# Patient Record
Sex: Male | Born: 1970 | Marital: Single | Smoking: Never smoker
Health system: Southern US, Community
[De-identification: ages and names within clinical notes are randomized; demographics above are authoritative.]

## PROBLEM LIST (undated history)

## (undated) DIAGNOSIS — K589 Irritable bowel syndrome without diarrhea: Secondary | ICD-10-CM

## (undated) DIAGNOSIS — N289 Disorder of kidney and ureter, unspecified: Secondary | ICD-10-CM

## (undated) DIAGNOSIS — T7840XA Allergy, unspecified, initial encounter: Secondary | ICD-10-CM

## (undated) DIAGNOSIS — E119 Type 2 diabetes mellitus without complications: Secondary | ICD-10-CM

## (undated) DIAGNOSIS — F419 Anxiety disorder, unspecified: Secondary | ICD-10-CM

## (undated) HISTORY — PX: OTHER SURGICAL HISTORY: SHX169

## (undated) HISTORY — DX: Irritable bowel syndrome without diarrhea: K58.9

## (undated) HISTORY — DX: Anxiety disorder, unspecified: F41.9

## (undated) HISTORY — DX: Type 2 diabetes mellitus without complications: E11.9

## (undated) HISTORY — DX: Allergy, unspecified, initial encounter: T78.40XA

## (undated) HISTORY — DX: Disorder of kidney and ureter, unspecified: N28.9

---

## 2014-04-10 ENCOUNTER — Ambulatory Visit: Payer: Self-pay | Admitting: Urology

## 2014-04-10 LAB — CBC
HCT: 44.5 % (ref 40.0–52.0)
HGB: 14.9 g/dL (ref 13.0–18.0)
MCH: 31.3 pg (ref 26.0–34.0)
MCHC: 33.4 g/dL (ref 32.0–36.0)
MCV: 94 fL (ref 80–100)
Platelet: 237 10*3/uL (ref 150–440)
RBC: 4.76 10*6/uL (ref 4.40–5.90)
RDW: 13.5 % (ref 11.5–14.5)
WBC: 9.1 10*3/uL (ref 3.8–10.6)

## 2014-04-10 LAB — URINALYSIS, COMPLETE
Bacteria: NONE SEEN
Bilirubin,UR: NEGATIVE
Glucose,UR: 50 mg/dL (ref 0–75)
LEUKOCYTE ESTERASE: NEGATIVE
NITRITE: NEGATIVE
PH: 5 (ref 4.5–8.0)
Specific Gravity: 1.03 (ref 1.003–1.030)
Squamous Epithelial: NONE SEEN

## 2014-04-10 LAB — COMPREHENSIVE METABOLIC PANEL
ANION GAP: 8 (ref 7–16)
Albumin: 4.2 g/dL (ref 3.4–5.0)
Alkaline Phosphatase: 53 U/L (ref 46–116)
BILIRUBIN TOTAL: 0.5 mg/dL (ref 0.2–1.0)
BUN: 13 mg/dL (ref 7–18)
CHLORIDE: 104 mmol/L (ref 98–107)
Calcium, Total: 9.6 mg/dL (ref 8.5–10.1)
Co2: 28 mmol/L (ref 21–32)
Creatinine: 1.15 mg/dL (ref 0.60–1.30)
EGFR (African American): >60
Glucose: 175 mg/dL — ABNORMAL HIGH (ref 65–99)
OSMOLALITY: 284 (ref 275–301)
Potassium: 3.5 mmol/L (ref 3.5–5.1)
SGOT(AST): 33 U/L (ref 15–37)
SGPT (ALT): 43 U/L (ref 14–63)
SODIUM: 140 mmol/L (ref 136–145)
Total Protein: 7.6 g/dL (ref 6.4–8.2)

## 2014-04-10 LAB — LIPASE, BLOOD: Lipase: 121 U/L (ref 73–393)

## 2014-06-04 ENCOUNTER — Ambulatory Visit: Payer: Self-pay | Admitting: Urology

## 2014-06-07 ENCOUNTER — Ambulatory Visit
Admit: 2014-06-07 | Disposition: A | Discharge status: Home / Self Care | Payer: Self-pay | Attending: Urology | Admitting: Urology

## 2014-06-07 LAB — URINALYSIS, COMPLETE
Bacteria: NONE SEEN
Bilirubin,UR: NEGATIVE
Glucose,UR: NEGATIVE mg/dL (ref 0–75)
KETONE: NEGATIVE
Nitrite: NEGATIVE
Ph: 5 (ref 4.5–8.0)
Protein: 100
SQUAMOUS EPITHELIAL: NONE SEEN
Specific Gravity: 1.017 (ref 1.003–1.030)
WBC UR: 19 /HPF (ref 0–5)

## 2014-06-07 LAB — BASIC METABOLIC PANEL
Anion Gap: 7 (ref 7–16)
BUN: 11 mg/dL
CO2: 30 mmol/L
Calcium, Total: 9.4 mg/dL
Chloride: 102 mmol/L
Creatinine: 0.78 mg/dL
EGFR (Non-African Amer.): >60
Glucose: 112 mg/dL — ABNORMAL HIGH
Potassium: 3.8 mmol/L
SODIUM: 139 mmol/L

## 2014-06-07 LAB — CBC
HCT: 42.1 % (ref 40.0–52.0)
HGB: 14.1 g/dL (ref 13.0–18.0)
MCH: 31.6 pg (ref 26.0–34.0)
MCHC: 33.5 g/dL (ref 32.0–36.0)
MCV: 94 fL (ref 80–100)
PLATELETS: 236 10*3/uL (ref 150–440)
RBC: 4.46 10*6/uL (ref 4.40–5.90)
RDW: 13.4 % (ref 11.5–14.5)
WBC: 4.8 10*3/uL (ref 3.8–10.6)

## 2014-06-07 LAB — PROTIME-INR
INR: 1
PROTHROMBIN TIME: 13.1 s

## 2014-06-08 LAB — URINE CULTURE

## 2014-06-12 ENCOUNTER — Ambulatory Visit
Admit: 2014-06-12 | Disposition: A | Discharge status: Home / Self Care | Payer: Self-pay | Attending: Urology | Admitting: Urology

## 2014-06-13 LAB — BASIC METABOLIC PANEL
Anion Gap: 8 (ref 7–16)
BUN: 8 mg/dL
CO2: 27 mmol/L
CREATININE: 0.82 mg/dL
Calcium, Total: 8.5 mg/dL — ABNORMAL LOW
Chloride: 105 mmol/L
GLUCOSE: 111 mg/dL — AB
Potassium: 3.6 mmol/L
SODIUM: 140 mmol/L

## 2014-06-13 LAB — CBC WITH DIFFERENTIAL/PLATELET
Basophil #: 0 10*3/uL (ref 0.0–0.1)
Basophil %: 0.5 %
EOS ABS: 0 10*3/uL (ref 0.0–0.7)
Eosinophil %: 0.4 %
HCT: 39.7 % — AB (ref 40.0–52.0)
HGB: 13.1 g/dL (ref 13.0–18.0)
LYMPHS ABS: 2.5 10*3/uL (ref 1.0–3.6)
Lymphocyte %: 29.4 %
MCH: 30.9 pg (ref 26.0–34.0)
MCHC: 32.8 g/dL (ref 32.0–36.0)
MCV: 94 fL (ref 80–100)
Monocyte #: 0.7 x10 3/mm (ref 0.2–1.0)
Monocyte %: 8.1 %
NEUTROS ABS: 5.3 10*3/uL (ref 1.4–6.5)
NEUTROS PCT: 61.6 %
Platelet: 219 10*3/uL (ref 150–440)
RBC: 4.22 10*6/uL — ABNORMAL LOW (ref 4.40–5.90)
RDW: 13.4 % (ref 11.5–14.5)
WBC: 8.6 10*3/uL (ref 3.8–10.6)

## 2014-07-07 NOTE — Op Note (Signed)
PATIENT NAME:  Nicholas Mitchell, Nicholas Mitchell MR#:  [id number] DATE OF BIRTH:  June 19, 1970  DATE OF PROCEDURE:  06/12/2014  PREOPERATIVE DIAGNOSIS:  Right kidney stone.   POSTOPERATIVE DIAGNOSIS:  Right kidney stone.   PROCEDURES PERFORMED:  1. Right percutaneous nephrolithotomy.  2. Right antegrade nephrostogram.  3. Right ureteral stent removal.  4. Basket extraction of kidney stones.  5. Interpretation of fluoroscopy less than 30 minutes.  6.  Placement of right nephrostomy tube  ATTENDING SURGEON: Claris GladdenAshley J. Minh Jasper, MD.    ANESTHESIA: General anesthesia.   ESTIMATED BLOOD LOSS: 100 mL.   DRAINS: A 16 French Foley catheter, 7818 French nephrostomy tube, a 5 [place] open-ended ureteral catheter.   SPECIMEN: Stone fragment.   COMPLICATIONS: None.   INDICATION: This is a 44 year old male who presented previously to the Emergency Room with acute onset right flank pain, found to have a punctate UVJ stone as well as a quite large 1.5 cm obstructing right renal pelvic stone, as well as some nonobstructing lower pole stones. He underwent urgent ureteral stent placement at the time and returned today for definitive management of his larger upper tract stones. We discussed various treatment options including ureteroscopy versus ESWL, versus percutaneous nephrolithotomy and he has elected to undergo percutaneous nephrolithotomy to treat all of his stones simultaneously. Risks and benefits of the procedure were explained in detail. The patient agreed to proceed with the planned procedure.    DESCRIPTION OF PROCEDURE:  The patient was correctly identified in the preoperative holding area and informed consent was confirmed. He was brought to the operating suite and placed on the table in supine position. At this time universal timeout protocol was performed. All team members were identified. Venodyne boots were placed and the patient was administered 500 mg of IV Levaquin in the perioperative period. He was then placed  under general anesthesia and intubated, A Foley catheter was placed sterilely on the field. He was then carefully turned in the prone position onto the operating room table from the stretcher and care was taken to pad all pressure points including his axillae, hips, knees, and feet.  He was strapped to the table using 3 separate straps. His nephroureteral stent dressing was removed which had been previously placed in interventional radiology and he was prepped and draped in the standard surgical fashion. At this point in time the 5 French nephroureteral stent which had been placed in interventional radiology was cannulated using a 0.038 Sensor wire down to the level of the bladder without difficulty, confirmed under fluoroscopic guidance. The catheter was removed. An approximately centimeter long incision was made in the right flank at the site of the wire insertion and a safety wire introducer was advanced to the level of the renal pelvis, allowing for the introduction of a Super Stiff wire down to the level of the bladder. The Sensor wire was placed, then snapped in place as a safety wire and the Super Stiff wire was used at this point as a working wire. A fascial incisor needle was placed over the wire into the fascia to create a cruciate type incision within the patient's fascia. A NephroMax 30 cm balloon was then advanced over the wire such that the distal tip of the balloon was located within the lower pole infundibulum. Once in the appropriate position the balloon was inflated up to 18 cm of water and left in place for several minutes to dilate the nephrostomy tube tract. A 30 cm access sheath was advanced over this balloon just  to the distal tip of the balloon under fluoroscopic guidance without difficulty. The balloon was then deflated and removed. There was no significant bleeding noted at this point in time. The nephroscope was then introduced through the sheath into the lower pole, at which time normal  mucosa was identified within the kidney. The scope was able to be advanced to the level of the renal pelvis, at which time the large 1.5 cm stone was encountered. The CyberWand ultrasonic lithotripter was then used to fragment the stone and suck up the pieces. The stone fragmented quite nicely.  Once the stone was completely obliterated and all remaining fragments were removed I then attempted to access the extreme lower pole using the nephroscope, but given the angle was unable to do so.  I then brought in a flexible cystoscope and was ultimately able with the flexible cystoscope and backing the sheath up slightly to access the extreme lower pole, confirmed under fluoroscopic guidance, 2 small stones were identified and able to be basketed out of the lower pole using a 1.9 [place] tipless Nitinol basket. Once complete an antegrade nephrostogram was performed which revealed absolutely no urinary extravasation and a well-defined decompressed collecting system, contrast was seen within the proximal ureter. The stent which had been previously placed was grasped using stent graspers and removed. A second nephrostogram was performed and again contrast was seen going down the ureter. The flexible scope was also able to be advanced into the proximal ureter without difficulty, proving its patency. At this point in time the procedure was deemed complete.  An 71 [place] tip catheter was advanced over the Super Stiff wire such that the tip was just within the renal pelvis. The balloon was inflated with approximately 2 mL of a mixture of contrast and sterile water in order to confirm adequate position. Final antegrade nephrostogram was performed, confirming good position without urinary extravasation. The sheath was backed over the nephrostomy tube and out of the patient's body and removed. A 5 French open-ended ureteral catheter was advanced over the Sensor wire down to the level of the mid ureter without difficulty and  the wire was removed. Each of these 2 drains were sutured in place using a silk suture x 2. The patient was cleaned and dried. A dressing of 4 x 4s, an EBD pad, and foam tape was applied. The patient was then repositioned in the supine position back on the adjacent stretcher, reversed from anesthesia, extubated, and taken to the PACU in stable condition. There were no complications in this case.    ____________________________ Claris Gladden, MD ajb:bu D: 06/12/2014 15:49:19 ET T: 06/12/2014 20:58:07 ET JOB#: [id number]  cc: Claris Gladden, MD, <Dictator> Claris Gladden MD ELECTRONICALLY SIGNED 06/18/2014 18:19

## 2014-07-07 NOTE — Discharge Summary (Signed)
Dates of Admission and Diagnosis:  Date of Admission 12-Jun-2014   Date of Discharge 13-Jun-2014   Admitting Diagnosis Right renal stones   Final Diagnosis Right renal stones   Discharge Diagnosis 1 Right renal stones    Chief Complaint/History of Present Illness See admission H&P   Allergies:  No Known Allergies:   Routine Chem:  07-Apr-16 05:26   Glucose, Serum  111 (65-99 NOTE: New Reference Range  05/14/14)  BUN 8 (6-20 NOTE: New Reference Range  05/14/14)  Creatinine (comp) 0.82 (0.61-1.24 NOTE: New Reference Range  05/14/14)  Sodium, Serum 140 (135-145 NOTE: New Reference Range  05/14/14)  Potassium, Serum 3.6 (3.5-5.1 NOTE: New Reference Range  05/14/14)  Chloride, Serum 105 (101-111 NOTE: New Reference Range  05/14/14)  CO2, Serum 27 (22-32 NOTE: New Reference Range  05/14/14)  Calcium (Total), Serum  8.5 (8.9-10.3 NOTE: New Reference Range  05/14/14)  Anion Gap 8  eGFR (African American) >60  eGFR (Non-African American) >60 (eGFR values <74m/min/1.73 m2 may be an indication of chronic kidney disease (CKD). Calculated eGFR is useful in patients with stable renal function. The eGFR calculation will not be reliable in acutely ill patients when serum creatinine is changing rapidly. It is not useful in patients on dialysis. The eGFR calculation may not be applicable to patients at the low and high extremes of body sizes, pregnant women, and vegetarians.)  Routine Hem:  07-Apr-16 05:26   WBC (CBC) 8.6  RBC (CBC)  4.22  Hemoglobin (CBC) 13.1  Hematocrit (CBC)  39.7  Platelet Count (CBC) 219  MCV 94  MCH 30.9  MCHC 32.8  RDW 13.4  Neutrophil % 61.6  Lymphocyte % 29.4  Monocyte % 8.1  Eosinophil % 0.4  Basophil % 0.5  Neutrophil # 5.3  Lymphocyte # 2.5  Monocyte # 0.7  Eosinophil # 0.0  Basophil # 0.0 (Result(s) reported on 13 Jun 2014 at 05:59AM.)   Pertinent Past History:  Pertinent Past History See admissino H&P   Hospital Course:   Hospital Course Patient was admitted after surgery for post op observation.  His Foley catheter was removed on the AM on POD 1 and his nephrostomy tube was clamped.  His labs remained stable.  Later that day, his nephrostomy tube was removed.  His diet was advanced, he was ambulating, and his pain was well controlled.  He was discharged home in  stable condition on POD1.   Condition on Discharge Good   Code Status:  Code Status Full Code   DISCHARGE INSTRUCTIONS HOME MEDS:  Medication Reconciliation: Patient's Home Medications at Discharge:     Medication Instructions  tylenol 325 mg oral tablet  2 tab(s) orally every 4 hours, As Needed - for Pain   flonase 50 mcg/inh nasal spray  1 spray(s) nasal once a day   urezol eye drops     flomax 0.4 mg oral capsule  1 cap(s) orally once a day   acetaminophen-oxycodone 325 mg-5 mg oral tablet  1 tab(s) orally every 6 hours, As Needed - for Pain   colace 100 mg oral capsule  1 cap(s) orally     PRESCRIPTIONS: PRINTED AND PLACED ON CHART   Physician's Instructions:  Home Health? No   Treatments None   Dressing Care Replace dressing as necessary.  May shower.  It is normal to leak urine from the back for several days post op.   Home Oxygen? No   Diet Regular   Dietary Supplements None   Diet Consistency Regular  Consistency   Activity Limitations As tolerated   Return to Work as tolerated   Time frame for Follow Up Appointment 4-6 weeks     Hollice Espy J(Attending Physician): [facility], [address], [place], [place], [place] [address], [place] [phone]  TIME SPENT:  Total Time: 30 minutes or less   Electronic Signatures: Sherlynn Stalls (MD)  (Signed 07-Apr-16 09:44)  Authored: ADMISSION DATE AND DIAGNOSIS, CHIEF COMPLAINT/HPI, Allergies, PERTINENT LABS, PERTINENT PAST HISTORY, HOSPITAL COURSE, DISCHARGE INSTRUCTIONS HOME MEDS, PATIENT INSTRUCTIONS, Follow Up Physician, TIME SPENT   Last Updated:  07-Apr-16 09:44 by Sherlynn Stalls (MD)

## 2014-07-07 NOTE — Consult Note (Signed)
Admit Diagnosis:   RT SIDE PAIN: Onset Date: 10-Apr-2014, Status: Active, Description: RT SIDE PAIN  Lab Results: Hepatic:  03-Feb-16 11:26   Bilirubin, Total 0.5  Alkaline Phosphatase 53  SGPT (ALT) 43  SGOT (AST) 33  Total Protein, Serum 7.6  Albumin, Serum 4.2  Routine Chem:  03-Feb-16 11:26   Lipase 121 (Result(s) reported on 10 Apr 2014 at 12:27PM.)  Glucose, Serum  175  BUN 13  Creatinine (comp) 1.15  Sodium, Serum 140  Potassium, Serum 3.5  Chloride, Serum 104  CO2, Serum 28  Calcium (Total), Serum 9.6  Osmolality (calc) 284  eGFR (African American) >60  eGFR (Non-African American) >60 (eGFR values <21m/min/1.73 m2 may be an indication of chronic kidney disease (CKD). Calculated eGFR, using the MRDR Study equation, is useful in  patients with stable renal function. The eGFR calculation will not be reliable in acutely ill patients when serum creatinine is changing rapidly. It is not useful in patients on dialysis. The eGFR calculation may not be applicable to patients at the low and high extremes of body sizes, pregnant women, and vegetarians.)  Anion Gap 8  Routine UA:  03-Feb-16 12:35   Color (UA) Yellow  Clarity (UA) Clear  Glucose (UA) 50 mg/dL  Bilirubin (UA) Negative  Ketones (UA) 2+  Specific Gravity (UA) 1.030  Blood (UA) 2+  pH (UA) 5.0  Protein (UA) 30 mg/dL  Nitrite (UA) Negative  Leukocyte Esterase (UA) Negative (Result(s) reported on 10 Apr 2014 at 12:57PM.)  RBC (UA) 41 /HPF  WBC (UA) 1 /HPF  Bacteria (UA) NONE SEEN  Epithelial Cells (UA) NONE SEEN  Mucous (UA) PRESENT  Calcium Oxalate Crystal (UA) PRESENT (Result(s) reported on 10 Apr 2014 at 12:57PM.)  Routine Hem:  03-Feb-16 11:26   WBC (CBC) 9.1  RBC (CBC) 4.76  Hemoglobin (CBC) 14.9  Hematocrit (CBC) 44.5  Platelet Count (CBC) 237 (Result(s) reported on 10 Apr 2014 at 12:22PM.)  MCV 94  MCH 31.3  MCHC 33.4  RDW 13.5   Radiology Results:  Radiology Results: [place]     03-Feb-16 13:25, [place]  [place]  REASON FOR EXAM:    RUQ pain + tender, R flank pain  COMMENTS:   May transport without cardiac monitor    PROCEDURE: [place] - [place]AL SURVEY  - Apr 10 2014  1:25PM     CLINICAL DATA:  Acute right upper quadrant abdominal pain and flank  pain with nausea and vomiting.    EXAM:  ULTRASOUND ABDOMEN COMPLETE    COMPARISON:  None.    FINDINGS:  Gallbladder: No gallstones or wall thickening visualized. No  sonographic Murphy sign noted.    Common bile duct: Diameter: 5 mm    Liver: No focal lesion identified. Within normal limits in  parenchymal echogenicity. Patent portal vein with normal hepatopetal  flow.    IVC: No abnormality visualized.    Pancreas: Visualized portion unremarkable.    Spleen: Size and appearance within normal limits.    Right Kidney: Length: 12.4 cm. Normal echogenicity. Mild  hydronephrosis noted. Echogenic shadowing renal pelvis calcification  measures 1.7 x 0.9 x 1.1 cm    Left Kidney: Length: 12.2 cm. Echogenicity within normal limits. No  mass or hydronephrosis visualized.    Abdominal aorta: No aneurysm visualized.    Other findings: None.     IMPRESSION:  1.7 cm right kidney pelvic calculus with mild hydronephrosis.    No other acute finding by  abdominal ultrasound.  Electronically Signed    By: TrevorShick M.D.    On: 04/10/2014 14:12         Verified By: Earl Gala, M.D.,    No Known Allergies:    General Aspect 44 yo M with sudden onset right flank pain, nausea and vomiting this AM presents to the ED.  Renal utlrasound revealed right mild hydronephrosis and 1.7 cm calcification within the renal pelvis consistent with a large stone.   His pain has been difficult to control in the ED requiring x3 doses IV narcotics.  He has had similar pain 3 months ago which lasted a day or two but resolved spontaneously.  Labs and UA wnl other than microsocpic blood.     He denies a history of kidney stones.  He is otherwise healthy and takes no medications.  No allergies.   12 point review of systems is negative other than as per HPI   Case History and Physical Exam:  Chief Complaint Nausea/Vomiting  Right flank pain   Past Medical Health None   Past Surgical History None   Primary Care Provider None  Just moved to area from [place]  No smoking, EtOH, or drugs.  Married with 3 children .  Just started job at [employer]  No Adenopathy   Chest/Lungs Clear   Cardiovascular No Murmurs or Gallops  Normal Sinus Rhythm   Abdomen Benign  Mild right CVA tenderness   Genitalia WNL   Musculoskeletal Full range of motion   Neurological Grossly WNL   Skin Warm  WNL    Impression 44 yo M with possible 1.7 cm right UPJ stone (on RUS), mild right hydronephrosis with poorly controlled pain.  No evidence of sepsis or concern for infection.  We discussed all options including admission for pain control and / or proceding with ureteral stent placement vs. perc tube for the purpose of pain managment.  We discussed the procedure including need for definatively management of his stone at a later date.  He would like to procede with ureteral stent placement.   Plan -NPO -plan for right ureteral stent placement in OR -will likely be discharged from PACU -patient will need outpatient follow up for definative management of his stone -he will need noncontrast CT scan as outpatient prior to definative stone procedure to assess true size and location of stone   Electronic Signatures: Sherlynn Stalls (MD)  (Signed 03-Feb-16 15:13)  Authored: Health Issues, Labs, Radiology Results, Allergies, General Aspect/Present Illness, History and Physical Exam, Impression/Plan   Last Updated: 03-Feb-16 15:13 by Sherlynn Stalls (MD)

## 2014-07-07 NOTE — Op Note (Signed)
PATIENT NAME:  Nicholas Mitchell, Jaymari MR#:  [id number] DATE OF BIRTH:  [birth year]  DATE OF PROCEDURE:  04/10/2014  PREOPERATIVE DIAGNOSES: Right ureteral stone, right ureteropelvic stone.   POSTOPERATIVE DIAGNOSES: Right ureteral stone, right ureteropelvic stone.   PROCEDURES PERFORMED: Right ureteral stent placement, cystoscopy.   ATTENDING SURGEON: Claris GladdenAshley J. Talya Quain, MD  ANESTHESIA: General anesthesia with mask LMA airway.   ESTIMATED BLOOD LOSS: Minimal.   DRAINS: A 6 x 26 French double-J ureteral stent on right.   COMPLICATIONS: None.   SPECIMENS: None.   INDICATION: This is a 44 year old male who presented to the Emergency Room today with acute onset right flank pain. Initially, renal ultrasound showed a 17 mm stone within the renal pelvis with some mild hydronephrosis. He did, just before coming up to the Operating Room, have a noncontrast CT scan, which showed a 1 mm stone right at the UVJ and hydroureteronephrosis down to this level, as well as a 17 mm stone in the right renal pelvis. His pain was poorly controlled, so he was counseled to undergo a right ureteral stent placement. Risks and benefits of the procedure explained in detail. The patient agreed to proceed with the plan.   PROCEDURE IN DETAIL: The patient was correctly identified in the preoperative holding area and informed consent was confirmed. He was brought to the operating suite and placed on the table in supine position. At this time, a universal timeout protocol was performed. All team members were identified. Venodyne boots were placed, and the patient was administered 1 gram of IV Ancef in the perioperative period. He was then placed under general anesthesia and repositioned on the bed in the dorsal lithotomy position. He was prepped and draped in standard surgical fashion. At this point in time, a rigid cystoscope was advanced per urethra into the bladder using a 22 French access sheath. Attention was turned to the right  ureteral orifice, which a small calcification was seen just within the UO. I did attempt to use some graspers to remove this; however, I was unsuccessful. I then went ahead and placed a 0.038 Sensor wire up to the level of the kidney, as well as a 5 [place] open-ended ureteral catheter just within the UO to help facilitate this. Upon removal of the 5 [place] ureteral catheter, a small calcification did dislodge into the bladder, and there was a copious amount of efflux from the right UO, consistent with postobstructive diuresis. This was relatively clear without any evidence of frank purulent. The position of the wire in the kidney was confirmed under fluoroscopy and the large 17 mm stone could also be seen. A 6 x 26 French double-J ureteral stent was then placed over the wire up to the level of the renal pelvis. The wire was partially withdrawn and a coil was noted within the renal pelvis. The wire was then fully withdrawn and a full coil was noted within the bladder. The bladder was drained. The scope was removed. The patient was repositioned in the supine position, reversed from anesthesia, and taken to the PACU in stable condition. There were no complications in this case.   PLAN: The patient will be discharged home with a stent in place with plans for close follow-up in urology clinic. Given the site of the stone, all possible procedures, including ureteroscopy, ESWL, and PCNL are all possibilities, which we will discuss further at a further date.   Thank you for allowing me to participate in the care of this patient.   ____________________________ Morrie SheldonAshley  Alverda Skeans, MD ajb:mw D: 04/10/2014 16:40:20 ET T: 04/10/2014 20:09:39 ET JOB#: [id number]  cc: Claris Gladden, MD, <Dictator> Claris Gladden MD ELECTRONICALLY SIGNED 04/30/2014 12:35

## 2014-07-25 ENCOUNTER — Ambulatory Visit
Admission: RE | Admit: 2014-07-25 | Discharge: 2014-07-25 | Disposition: A | Discharge status: Home / Self Care | Payer: Managed Care, Other (non HMO) | Source: Ambulatory Visit | Attending: Urology | Admitting: Urology

## 2014-07-25 ENCOUNTER — Other Ambulatory Visit: Payer: Self-pay | Admitting: Urology

## 2014-07-25 DIAGNOSIS — N200 Calculus of kidney: Secondary | ICD-10-CM

## 2014-07-25 DIAGNOSIS — Z87442 Personal history of urinary calculi: Secondary | ICD-10-CM | POA: Insufficient documentation

## 2014-11-22 DIAGNOSIS — H2013 Chronic iridocyclitis, bilateral: Secondary | ICD-10-CM | POA: Insufficient documentation

## 2014-11-22 DIAGNOSIS — H40043 Steroid responder, bilateral: Secondary | ICD-10-CM | POA: Insufficient documentation

## 2014-11-22 DIAGNOSIS — J329 Chronic sinusitis, unspecified: Secondary | ICD-10-CM | POA: Insufficient documentation

## 2015-12-15 ENCOUNTER — Ambulatory Visit: Payer: Managed Care, Other (non HMO) | Attending: Internal Medicine

## 2015-12-15 DIAGNOSIS — M542 Cervicalgia: Secondary | ICD-10-CM | POA: Insufficient documentation

## 2015-12-15 NOTE — Therapy (Signed)
[facility] [address], [place], [address] Phone: [phone]   Fax:  [phone]  Physical Therapy Evaluation  Patient Details  Name: Nicholas Mitchell MRN: [id number] Date of Birth: 1971/03/02 Referring Provider: Aundria Mems, MD  Encounter Date: 12/15/2015      PT End of Session - 12/15/15 1653    Visit Number 1   Number of Visits 13   Date for PT Re-Evaluation 01/29/16   PT Start Time 1653   PT Stop Time 1750   PT Time Calculation (min) 57 min   Activity Tolerance Patient tolerated treatment well   Behavior During Therapy [facility] for tasks assessed/performed      Past Medical History:  Diagnosis Date  . Allergy   . Anxiety   . Diabetes mellitus without complication (HCC)   . Irritable bowel syndrome   . Kidney disease     Past Surgical History:  Procedure Laterality Date  . kidney stone removal      There were no vitals filed for this visit.       Subjective Assessment - 12/15/15 1703    Subjective neck pain: 8/10 current. 10/10 neck pain at most for the past 2 months. Pt also states feeling L supraorbital nerve pain and discomfort at his scalp.    Pertinent History Neck pain. Pt works at [employer] and is in a constant position in which he is looking down, working on [employer] for 8-10 hours. Has had neck pain for about 2 years.  Denies bilateral UE paresthesias. Pt is L hand dominant.  Had an x-ray for his neck from his chirpractor which revealed decreased curvature at his neck.  Feels pain in his L neck and L gastroc. Had pain medication which did not help.  Had chiripractic treatment which helped him bend his head to the L.    Patient Stated Goals Patient expresses desire to get better.    Currently in Pain? Yes   Pain Score 8    Pain Location Neck   Pain Orientation Left   Pain Descriptors / Indicators Spasm   Pain Type Chronic pain   Pain Onset More than a month ago   Pain Frequency  Constant   Aggravating Factors  looking down, tilting his head to the left and right, turning his head to the L and R   Pain Relieving Factors looking up             [facility] PT Assessment - 12/15/15 0001      Assessment   Medical Diagnosis Cervicalgia   Referring Provider Aundria Mems, MD   Onset Date/Surgical Date 03/08/13  Neck pain began 2 years ago. No specific date provided.   Hand Dominance Left   Prior Therapy Pt received chiropractic care which helped him tilt his head to the L.      Precautions   Precaution Comments No known precautions.  Pt however does not like to be on his stomach due to his irritable bowel.      Restrictions   Other Position/Activity Restrictions No known restrictions     Balance Screen   Has the patient fallen in the past 6 months No   Has the patient had a decrease in activity level because of a fear of falling?  No   Is the patient reluctant to leave their home because of a fear of falling?  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      Observation/Other Assessments   Observations  (-) Alar ligament test, (-) VBI test     Posture/Postural Control   Posture Comments bilaterally protracted shoulders and neck, neck forward flexed. Slight increased R cervical paraspinal muscle tension     AROM   Overall AROM Comments bilateral UE AROM: WFL   Cervical Flexion Full with pain at the cervicothoracic junction area   Cervical Extension full with bilateral shoulder pain   Cervical - Right Side [facility] with L lateral neck pull   Cervical - Left Side [facility] with R lateral neck pull (less than R side bend)   Cervical - Right Rotation full with L lateral neck pull    Cervical - Left  Rotation full with L lateral neck pull     Strength   Right Shoulder Flexion 5/5   Right Shoulder ABduction 5/5   Left Shoulder Flexion 5/5   Left Shoulder ABduction 5/5   Right Elbow Flexion 5/5   Right Elbow Extension 5/5   Left Elbow Flexion 5/5   Left Elbow Extension 5/5   Right Wrist Extension 5/5   Left Wrist Extension 5/5     Palpation   Palpation comment muscle knots L UT, L lateral C2 area. L UT muscle tension.  Decreased P to A to R C2, C3, C4, C5 area with TTP. TTP C2,C3, C4, C5, C6 trasverse process L > R with R side more palpatble (evened out with movement) .         Objectives  There-ex  Directed patient with seated chin tucks 10x5 second for 3 sets  Seated bilateral scapular retraction 10x5 seconds  Reviewed and given afoermentioned exercises as part of his home exercise program. Pt demonstrated and verbalized understanding.   Improved exercise technique, movement at target joints, use of target muscles after mod verbal, visual, tactile cues.                        PT Education - 12/15/15 1926    Education provided Yes   Education Details ther-ex, HEP, plan of care   Person(s) Educated Patient   Methods Explanation;Demonstration;Tactile cues;Verbal cues   Comprehension Returned demonstration;Verbalized understanding             PT Long Term Goals - 12/15/15 1936      PT LONG TERM GOAL #1   Title Patient will have a decrease in neck pain to 5/10 or less at worst to promote ability to perform work related tasks.   Baseline 10/10 neck pain at worst.    Time 6   Period Weeks   Status New     PT LONG TERM GOAL #2   Title Patient will be able to perform cervical AROM all planes with minimal to no complain of neck pain to promote ability to move his head.    Time 6   Period Weeks   Status New     PT LONG TERM GOAL #3   Title Patient will be able to tolerate performing work related duties for at least 2 hours without increase in  neck pain to promote ability perform his job more comfortably.    Time 6   Period  Weeks   Status New               Plan - 12/15/15 1927    Clinical Impression Statement Patient is a 45 year old male who came to physical therapy secondary to neck pain. He also presents with altered posture, scapular weakness, L upper trap muscle tension, reproduction of symptoms with cervical AROM, and difficulty performing functional tasks such as turning and tilting his head as well as looking down at work. Patient will benefit from skilled physical therapy services to address the aforementioned deficits.    Rehab Potential Good   Clinical Impairments Affecting Rehab Potential Chronicity of condition, prolonged position in cervical flexion (8-10 hours) at work   PT Frequency 2x / week   PT Duration 6 weeks   PT Treatment/Interventions Manual techniques;Neuromuscular re-education;Therapeutic exercise;Therapeutic activities;Dry needling;Patient/family education;Ultrasound;Electrical Stimulation   PT Next Visit Plan scapular strengthening, cervical strengthening, manual therapy, posture, ergonomics   Consulted and Agree with Plan of Care Patient      Patient will benefit from skilled therapeutic intervention in order to improve the following deficits and impairments:  Pain, Postural dysfunction  Visit Diagnosis: Cervicalgia - Plan: PT plan of care cert/re-cert     Problem List There are no active problems to display for this patient.   Loralyn Freshwater PT, DPT   12/15/2015, 8:04 PM  [facility] PHYSICAL AND SPORTS MEDICINE [address], [place], [address] Phone: [phone]   Fax:  [phone]  Name: Nicholas Mitchell MRN: [id number] Date of Birth: 06/06/1970

## 2015-12-15 NOTE — Patient Instructions (Signed)
  Gave chin tucks and bilateral scapular retractions as part of his HEP. Pt demonstrated and verbalized understanding.

## 2015-12-17 ENCOUNTER — Ambulatory Visit: Payer: Managed Care, Other (non HMO)

## 2015-12-17 DIAGNOSIS — M542 Cervicalgia: Secondary | ICD-10-CM

## 2015-12-17 NOTE — Therapy (Signed)
[facility] [address] [place], [place], [address] Phone: [phone]   Fax:  [phone]  Physical Therapy Treatment  Patient Details  Name: Nicholas Mitchell MRN: [id number] Date of Birth: 08/10/1970 Referring Provider: Aundria MemsSheikh Tejan-Sei, MD  Encounter Date: 12/17/2015      PT End of Session - 12/17/15 1558    Visit Number 2   Number of Visits 13   Date for PT Re-Evaluation 01/29/16   PT Start Time 1558   PT Stop Time 1640   PT Time Calculation (min) 42 min   Activity Tolerance Patient tolerated treatment well   Behavior [facility] for tasks assessed/performed      Past Medical History:  Diagnosis Date  . Allergy   . Anxiety   . Diabetes mellitus without complication (HCC)   . Irritable bowel syndrome   . Kidney disease     Past Surgical History:  Procedure Laterality Date  . kidney stone removal      There were no vitals filed for this visit.      Subjective Assessment - 12/17/15 1600    Subjective Neck is doing good. Worked from 11 pm to 11 am. 7/10 neck pain currently.    Pertinent History Neck pain. Pt works at [employer] and is in a constant position in which he is looking down, working on [employer] for 8-10 hours. Has had neck pain for about 2 years.  Denies bilateral UE paresthesias. Pt is L hand dominant.  Had an x-ray for his neck from his chirpractor which revealed decreased curvature at his neck.  Feels pain in his L neck and L gastroc. Had pain medication which did not help.  Had chiripractic treatment which helped him bend his head to the L.    Patient Stated Goals Patient expresses desire to get better.    Currently in Pain? Yes   Pain Score 7    Pain Onset More than a month ago                              Objectives    There-ex  Directed patient with seated manually resisted L scapular retraction targeting the lower trap muscles 10x3 with 5 seconds.    Decreased L UT muscle discomfort   Seated chin tucks 10x5 seconds   Then with manual resistance 10x5 seconds for 3 sets   No neck pain afterwards  Seated chin tuck posture: gentle manual perturbation to promote cervical muscle strength 3x  Seated gentle manually resisted cervical extension in chin tuck position, targeting the L cervical paraspinals 10x5 seconds   Gentle manually resisted upper cervical flexion (chin resisted by fist) 10x5 seconds for 3 sets  L posterior cervical symptoms during exercise which eases with rest    Standing bilateral shoulder extension resisting yellow band with scapular retraction for 10x3  Reviewed and given as part of his HEP.  Pt demonstrated and verbalized understanding  Improved exercise technique, movement at target joints, use of target muscles after mod verbal, visual, tactile cues.    Decreased L lateral upper cervical pain with manually resisted cervical extension in chin tuck position exercise and decreased L upper trap symptoms with activation of lower trap muscles.         PT Long Term Goals - 12/15/15 1936      PT LONG TERM GOAL #1   Title Patient will have a decrease in neck  pain to 5/10 or less at worst to promote ability to perform work related tasks.   Baseline 10/10 neck pain at worst.    Time 6   Period Weeks   Status New     PT LONG TERM GOAL #2   Title Patient will be able to perform cervical AROM all planes with minimal to no complain of neck pain to promote ability to move his head.    Time 6   Period Weeks   Status New     PT LONG TERM GOAL #3   Title Patient will be able to tolerate performing work related duties for at least 2 hours without increase in neck pain to promote ability perform his job more comfortably.    Time 6   Period Weeks   Status New               Plan - 12/17/15 1556    Clinical Impression Statement Decreased L lateral upper cervical pain with manually resisted cervical extension in  chin tuck position exercise and decreased L upper trap symptoms with activation of lower trap muscles.    Rehab Potential Good   Clinical Impairments Affecting Rehab Potential Chronicity of condition, prolonged position in cervical flexion (8-10 hours) at work   PT Frequency 2x / week   PT Duration 6 weeks   PT Treatment/Interventions Manual techniques;Neuromuscular re-education;Therapeutic exercise;Therapeutic activities;Dry needling;Patient/family education;Ultrasound;Electrical Stimulation   PT Next Visit Plan scapular strengthening, cervical strengthening, manual therapy, posture, ergonomics   Consulted and Agree with Plan of Care Patient      Patient will benefit from skilled therapeutic intervention in order to improve the following deficits and impairments:  Pain, Postural dysfunction  Visit Diagnosis: Cervicalgia     Problem List There are no active problems to display for this patient.   Loralyn Freshwater PT, DPT   12/17/2015, 4:59 PM  [facility] PHYSICAL AND SPORTS MEDICINE [address] [place], [address] Phone: [phone]   Fax:  [phone]  Name: Nicholas Mitchell MRN: [id number] Date of Birth: April 28, 1970

## 2015-12-17 NOTE — Patient Instructions (Signed)
  Strengthening: Resisted Extension    Hold band one in each hand, arms forward. Pull arms back, elbow straight. Squeeze shoulder blades together.   Repeat ___10_ times per set. Do _3___ sets per session. Do _1__ sessions per day.  http://orth.exer.us/832   Copyright  VHI. All rights reserved.

## 2015-12-22 ENCOUNTER — Ambulatory Visit: Payer: Managed Care, Other (non HMO)

## 2015-12-22 DIAGNOSIS — M542 Cervicalgia: Secondary | ICD-10-CM

## 2015-12-22 NOTE — Patient Instructions (Addendum)
Pt was recommended to perform 10x5 second chin tucks every 1-2 hours at work to help relieve neck discomfort. Pt demonstrated and verbalized understanding

## 2015-12-22 NOTE — Therapy (Signed)
[facility] [address] [place], [place], [address] Phone: [phone]   Fax:  [phone]  Physical Therapy Treatment  Patient Details  Name: Nicholas Mitchell MRN: [id number] Date of Birth: 12/21/1970 Referring Provider: Aundria MemsSheikh Tejan-Sei, MD  Encounter Date: 12/22/2015      PT End of Session - 12/22/15 1621    Visit Number 3   Number of Visits 13   Date for PT Re-Evaluation 01/29/16   PT Start Time 1621   PT Stop Time 1714   PT Time Calculation (min) 53 min   Activity Tolerance Patient tolerated treatment well   Behavior During Therapy [facility] for tasks assessed/performed      Past Medical History:  Diagnosis Date  . Allergy   . Anxiety   . Diabetes mellitus without complication (HCC)   . Irritable bowel syndrome   . Kidney disease     Past Surgical History:  Procedure Laterality Date  . kidney stone removal      There were no vitals filed for this visit.      Subjective Assessment - 12/22/15 1623    Subjective Neck feels not bad but not good. Yesterday was a very bad day. Neck bothered him Sunday morning after work. Did shoulder extension exercises which relaxed him.    Pertinent History Neck pain. Pt works at [employer] and is in a constant position in which he is looking down, working on [employer] for 8-10 hours. Has had neck pain for about 2 years.  Denies bilateral UE paresthesias. Pt is L hand dominant.  Had an x-ray for his neck from his chirpractor which revealed decreased curvature at his neck.  Feels pain in his L neck and L gastroc. Had pain medication which did not help.  Had chiripractic treatment which helped him bend his head to the L.    Patient Stated Goals Patient expresses desire to get better.    Currently in Pain? Yes   Pain Score --  No pain level provided.    Pain Onset More than a month ago                                 PT Education - 12/22/15 1644    Education provided Yes   Education Details ther-ex   Starwood HotelsPerson(s) Educated Patient   Methods Explanation;Demonstration;Tactile cues;Verbal cues   Comprehension Returned demonstration;Verbalized understanding        Objectives   Manual therapy  STM L UT muscle  No decrease in L UT symptoms   Muscle knot L UT. Palpation increased L lateral arm discomfort. Decreased when pressure was released.     There-ex   Pt was recommended to perform 10x5 second chin tucks every 1-2 hours at work to help relieve neck discomfort. Pt demonstrated and verbalized understanding    Directed patient with seated manually resisted L scapular retraction targeting the lower trap muscles 10x3 with 5 seconds.            Seated manually resisted R scapular retraction targeting the lower trap muscles 10x5 seconds for 3 sets   Seated chin tucks with manual resistance 10x5 seconds for 3 sets                          Seated chin tuck posture: gentle manual perturbation to promote cervical muscle strength 4x   Seated gentle manually resisted  cervical extension in chin tuck position, targeting the L cervical paraspinals 10x5 seconds for 3 sets   Decreased L temporal discomfort   Supine L shoulder serratus reach 10x3   Decreased L scapular pain  Supine open books 10x5 seconds  Pt asked if he could do bicep curls and standing tricep extension exercises at home. Pt was informed that it should be ok.     Improved exercise technique, movement at target joints, use of target muscles after mod verbal, visual, tactile cues.     Decreased neck pain today with activation of L posterior cervical muscles. Decreased L scapular pain with activation of serratus anterior muscles. Still demonstrates L UT pain and patient was recommended to continue with his shoulder extension HEP resisting theraband since it helps him.           PT Long Term Goals - 12/15/15 1936      PT LONG TERM GOAL #1   Title  Patient will have a decrease in neck pain to 5/10 or less at worst to promote ability to perform work related tasks.   Baseline 10/10 neck pain at worst.    Time 6   Period Weeks   Status New     PT LONG TERM GOAL #2   Title Patient will be able to perform cervical AROM all planes with minimal to no complain of neck pain to promote ability to move his head.    Time 6   Period Weeks   Status New     PT LONG TERM GOAL #3   Title Patient will be able to tolerate performing work related duties for at least 2 hours without increase in neck pain to promote ability perform his job more comfortably.    Time 6   Period Weeks   Status New               Plan - 12/22/15 1620    Clinical Impression Statement Decreased neck pain today with activation of L posterior cervical muscles. Decreased L scapular pain with activation of serratus anterior muscles. Still demonstrates L UT pain and patient was recommended to continue with his shoulder extension HEP resisting theraband since it helps him.    Rehab Potential Good   Clinical Impairments Affecting Rehab Potential Chronicity of condition, prolonged position in cervical flexion (8-10 hours) at work   PT Frequency 2x / week   PT Duration 6 weeks   PT Treatment/Interventions Manual techniques;Neuromuscular re-education;Therapeutic exercise;Therapeutic activities;Dry needling;Patient/family education;Ultrasound;Electrical Stimulation   PT Next Visit Plan scapular strengthening, cervical strengthening, manual therapy, posture, ergonomics   Consulted and Agree with Plan of Care Patient      Patient will benefit from skilled therapeutic intervention in order to improve the following deficits and impairments:  Pain, Postural dysfunction  Visit Diagnosis: Cervicalgia     Problem List There are no active problems to display for this patient.   Loralyn Freshwater PT, DPT   12/22/2015, 5:31 PM  [place] [facility]  PHYSICAL AND SPORTS MEDICINE [address] [place], [address] Phone: [phone]   Fax:  [phone]  Name: Nicholas Mitchell MRN: [id number] Date of Birth: 10-31-1970

## 2015-12-24 ENCOUNTER — Ambulatory Visit: Payer: Managed Care, Other (non HMO)

## 2015-12-24 DIAGNOSIS — M542 Cervicalgia: Secondary | ICD-10-CM | POA: Diagnosis not present

## 2015-12-24 NOTE — Patient Instructions (Signed)
Gave supine open books 10x3 with 5 second holds as part of his HEP. Pt demonstrated and verbalized understanding.

## 2015-12-24 NOTE — Therapy (Signed)
[facility] [address] [place], [place], [address] Phone: [phone]   Fax:  [phone]  Physical Therapy Treatment  Patient Details  Name: Nicholas Mitchell MRN: [id number] Date of Birth: 10/24/1970 Referring Provider: Aundria MemsSheikh Tejan-Sei, MD  Encounter Date: 12/24/2015      PT End of Session - 12/24/15 1435    Visit Number 4   Number of Visits 13   Date for PT Re-Evaluation 01/29/16   PT Start Time 1435   PT Stop Time 1517   PT Time Calculation (min) 42 min   Activity Tolerance Patient tolerated treatment well   Behavior During Therapy [facility] for tasks assessed/performed      Past Medical History:  Diagnosis Date  . Allergy   . Anxiety   . Diabetes mellitus without complication (HCC)   . Irritable bowel syndrome   . Kidney disease     Past Surgical History:  Procedure Laterality Date  . kidney stone removal      There were no vitals filed for this visit.      Subjective Assessment - 12/24/15 1437    Subjective Neck feels better. Just a little bit of pain L shoulder blade (rhomboid area at distal insertion), 5-6/10. The upper L neck is good right now. Also feels    Pertinent History Neck pain. Pt works at [employer] and is in a constant position in which he is looking down, working on [employer] for 8-10 hours. Has had neck pain for about 2 years.  Denies bilateral UE paresthesias. Pt is L hand dominant.  Had an x-ray for his neck from his chirpractor which revealed decreased curvature at his neck.  Feels pain in his L neck and L gastroc. Had pain medication which did not help.  Had chiripractic treatment which helped him bend his head to the L.    Patient Stated Goals Patient expresses desire to get better.    Currently in Pain? Yes   Pain Score 6   5-6/10   Pain Onset More than a month ago                                 PT Education - 12/24/15 1516    Education provided Yes    Education Details ther-ex, HEP   Person(s) Educated Patient   Methods Explanation;Demonstration;Tactile cues;Verbal cues   Comprehension Returned demonstration;Verbalized understanding        Objectives   Manual therapy  STM L rhomboid muscle area  No change in pain level STM L UT and levator scapula area (muscle knot)   Decreased pain.     There-ex     Directed patient with seated press ups 10x3.  Decreased L UT/distal rhomboid pain.   Felt pain/discomfort L mid thoracic spine.   seated chin tuck posture: gentle manual perturbation to promote cervical muscle strength 4x  Seated gentle manually resisted cervical extension in chin tuck position, targeting the L cervical paraspinals 10x5 seconds for 3 sets   Supine L shoulder serratus reach 10x3  Supine open books 10x5 seconds  Improved exercise technique, movement at target joints, use of target muscles after mod verbal, visual, tactile cues.    Pt making steady progress towards goal of decreased neck pain based on patient subjective. Decreased L upper trap/levator scapula area pain after soft tissue mobilization to decrease muscle tension. Decreased neck pain after session.  PT Long Term Goals - 12/15/15 1936      PT LONG TERM GOAL #1   Title Patient will have a decrease in neck pain to 5/10 or less at worst to promote ability to perform work related tasks.   Baseline 10/10 neck pain at worst.    Time 6   Period Weeks   Status New     PT LONG TERM GOAL #2   Title Patient will be able to perform cervical AROM all planes with minimal to no complain of neck pain to promote ability to move his head.    Time 6   Period Weeks   Status New     PT LONG TERM GOAL #3   Title Patient will be able to tolerate performing work related duties for at least 2 hours without increase in neck pain to promote ability perform his job more comfortably.    Time 6   Period Weeks   Status New                Plan - 12/24/15 1458    Clinical Impression Statement Pt making steady progress towards goal of decreased neck pain based on patient subjective. Decreased L upper trap/levator scapula area pain after soft tissue mobilization to decrease muscle tension. Decreased neck pain after session.    Rehab Potential Good   Clinical Impairments Affecting Rehab Potential Chronicity of condition, prolonged position in cervical flexion (8-10 hours) at work   PT Frequency 2x / week   PT Duration 6 weeks   PT Treatment/Interventions Manual techniques;Neuromuscular re-education;Therapeutic exercise;Therapeutic activities;Dry needling;Patient/family education;Ultrasound;Electrical Stimulation   PT Next Visit Plan scapular strengthening, cervical strengthening, manual therapy, posture, ergonomics   Consulted and Agree with Plan of Care Patient      Patient will benefit from skilled therapeutic intervention in order to improve the following deficits and impairments:  Pain, Postural dysfunction  Visit Diagnosis: Cervicalgia     Problem List There are no active problems to display for this patient.   Loralyn Freshwater PT, DPT   12/24/2015, 5:30 PM  [place] [facility] PHYSICAL AND SPORTS MEDICINE [address], [place], [address] Phone: [phone]   Fax:  [phone]  Name: Nicholas Mitchell MRN: [id number] Date of Birth: [birth year]

## 2015-12-29 ENCOUNTER — Ambulatory Visit: Payer: Managed Care, Other (non HMO)

## 2015-12-29 DIAGNOSIS — M542 Cervicalgia: Secondary | ICD-10-CM | POA: Diagnosis not present

## 2015-12-29 NOTE — Therapy (Signed)
[facility] [address], [place], [address] Phone: [phone]   Fax:  [phone]  Physical Therapy Treatment  Patient Details  Name: Findley Vi MRN: [id number] Date of Birth: 06/29/1970 Referring Provider: Aundria Mems, MD  Encounter Date: 12/29/2015      PT End of Session - 12/29/15 1348    Visit Number 5   Number of Visits 13   Date for PT Re-Evaluation 01/29/16   PT Start Time 1348   PT Stop Time 1429   PT Time Calculation (min) 41 min   Activity Tolerance Patient tolerated treatment well   Behavior During Therapy [facility] for tasks assessed/performed      Past Medical History:  Diagnosis Date  . Allergy   . Anxiety   . Diabetes mellitus without complication (HCC)   . Irritable bowel syndrome   . Kidney disease     Past Surgical History:  Procedure Laterality Date  . kidney stone removal      There were no vitals filed for this visit.      Subjective Assessment - 12/29/15 1349    Subjective Neck feels better. A little bit of pain currenlty, like 5/10.   Pertinent History Neck pain. Pt works at [employer] and is in a constant position in which he is looking down, working on [employer] for 8-10 hours. Has had neck pain for about 2 years.  Denies bilateral UE paresthesias. Pt is L hand dominant.  Had an x-ray for his neck from his chirpractor which revealed decreased curvature at his neck.  Feels pain in his L neck and L gastroc. Had pain medication which did not help.  Had chiripractic treatment which helped him bend his head to the L.    Patient Stated Goals Patient expresses desire to get better.    Currently in Pain? Yes   Pain Score 5    Pain Onset More than a month ago                                 PT Education - 12/29/15 1411    Education provided Yes   Education Details ther-ex   [employer]) Educated Patient   Methods Explanation;Demonstration;Tactile  cues;Verbal cues   Comprehension Returned demonstration;Verbalized understanding        Objectives   Manual therapy   STM L UT and levator scapula area (muscle knot)               There-ex    Directed patient with  seated chin tuck posture: gentle manual perturbation to promote cervical muscle strength 5x  Chin tuck resisting yellow band targeting L posterior cervical muscles 10x, then 10x2 with 5 second holds   Work towards HEP for aforementioned exercise for next visit.   Bilateral scapular retraction at [facility] machine resisting plate 10 for 69G, then plate 15 for 29B2   seated press ups 10x2 with 5 seconds holds              Supine L shoulder serratus reach 10x3 with 5 second holds  Seated gentle manually resisted cervical extension in chin tuck position, targeting the L cervical paraspinals 10x5 seconds for 3 sets     Improved exercise technique, movement at target joints, use of target muscles after min to mod verbal, visual, tactile cues.      Good L posterior cervical paraspinal muscle activation with exercises. Tolerated session  well without aggravation of symptoms. Slowly progressing towards goal of decreased neck pain based on subjective reports.              PT Long Term Goals - 12/15/15 1936      PT LONG TERM GOAL #1   Title Patient will have a decrease in neck pain to 5/10 or less at worst to promote ability to perform work related tasks.   Baseline 10/10 neck pain at worst.    Time 6   Period Weeks   Status New     PT LONG TERM GOAL #2   Title Patient will be able to perform cervical AROM all planes with minimal to no complain of neck pain to promote ability to move his head.    Time 6   Period Weeks   Status New     PT LONG TERM GOAL #3   Title Patient will be able to tolerate performing work related duties for at least 2 hours without increase in neck pain to promote ability perform his job more comfortably.    Time 6    Period Weeks   Status New               Plan - 12/29/15 1415    Clinical Impression Statement Good L posterior cervical paraspinal muscle activation with exercises. Tolerated session well without aggravation of symptoms. Slowly progressing towards goal of decreased neck pain based on subjective reports.    Rehab Potential Good   Clinical Impairments Affecting Rehab Potential Chronicity of condition, prolonged position in cervical flexion (8-10 hours) at work   PT Frequency 2x / week   PT Duration 6 weeks   PT Treatment/Interventions Manual techniques;Neuromuscular re-education;Therapeutic exercise;Therapeutic activities;Dry needling;Patient/family education;Ultrasound;Electrical Stimulation   PT Next Visit Plan scapular strengthening, cervical strengthening, manual therapy, posture, ergonomics   Consulted and Agree with Plan of Care Patient      Patient will benefit from skilled therapeutic intervention in order to improve the following deficits and impairments:  Pain, Postural dysfunction  Visit Diagnosis: Cervicalgia     Problem List There are no active problems to display for this patient.   Loralyn FreshwaterMiguel Laygo PT, DPT   12/29/2015, 7:14 PM  [place] [facility] SPORTS MEDICINE [address] [place], [place], [address] Phone: [phone]   Fax:  [phone]  Name: Doristine Sectionarig Fronek MRN: [id number] Date of Birth: 06/22/1970

## 2015-12-31 ENCOUNTER — Ambulatory Visit: Payer: Managed Care, Other (non HMO)

## 2015-12-31 DIAGNOSIS — M542 Cervicalgia: Secondary | ICD-10-CM

## 2015-12-31 NOTE — Patient Instructions (Addendum)
  Resisted External Rotation: in Neutral - Bilateral    Chin tuck, and squeeze shoulder blades together, arms slightly forward Sit or stand, tubing in both hands, elbows at sides, bent to 90, forearms forward. Rotate forearms out. Keep elbows at sides.  Repeat _10___ times per set. Do ___3_ sets per session. Do __1__ sessions per day or every other day.  http://orth.exer.us/967   Copyright  VHI. All rights reserved

## 2015-12-31 NOTE — Therapy (Signed)
[facility] [address] [place], [place], [address] Phone: [phone]   Fax:  [phone]  Physical Therapy Treatment  Patient Details  Name: Nicholas Mitchell MRN: [id number] Date of Birth: 09/09/1970 Referring Provider: Aundria MemsSheikh Tejan-Sei, MD  Encounter Date: 12/31/2015      PT End of Session - 12/31/15 1124    Visit Number 6   Number of Visits 13   Date for PT Re-Evaluation 01/29/16   PT Start Time 1125   PT Stop Time 1207   PT Time Calculation (min) 42 min   Activity Tolerance Patient tolerated treatment well   Behavior During Therapy [facility] for tasks assessed/performed      Past Medical History:  Diagnosis Date  . Allergy   . Anxiety   . Diabetes mellitus without complication (HCC)   . Irritable bowel syndrome   . Kidney disease     Past Surgical History:  Procedure Laterality Date  . kidney stone removal      There were no vitals filed for this visit.      Subjective Assessment - 12/31/15 1126    Subjective Neck feels better and good. 3-4/10 neck pain currently (R medial mid scapular area).   Pertinent History Neck pain. Pt works at [employer] and is in a constant position in which he is looking down, working on [employer] for 8-10 hours. Has had neck pain for about 2 years.  Denies bilateral UE paresthesias. Pt is L hand dominant.  Had an x-ray for his neck from his chirpractor which revealed decreased curvature at his neck.  Feels pain in his L neck and L gastroc. Had pain medication which did not help.  Had chiripractic treatment which helped him bend his head to the L.    Patient Stated Goals Patient expresses desire to get better.    Currently in Pain? Yes   Pain Score 4    Pain Onset More than a month ago                                 PT Education - 12/31/15 1133    Education provided Yes   Education Details ther-ex, HEP   Person(s) Educated Patient   Methods  Explanation;Demonstration;Tactile cues;Verbal cues;Handout   Comprehension Returned demonstration;Verbalized understanding       Objectives  There-ex  Directed patient with supine serratus reach 10x3 with 5 second holds  Slight increase in symptoms  Chin tuck resisting yellow band targeting L posterior cervical muscles 10x with 5 second holds   Slight increase in discomfort  Chin tucks with towel roll behind L head to promote L posterior cervical paraspinal muscle use 10x3 with 5 second holds   Standing bilateral shoulder ER with scapular retraction resisting yellow band  10x4  No upper trap area pain afterwards  seated chin tuck posture: gentle manual perturbation to promote cervical muscle strength 5x  Bilateral scapular retraction at [employer] machine resisting plate  plate 15 for 13K410x3  Then plate 20 for 40N10x   decreased L medial scapular pain afterwards  Table push ups 10x  Improved exercise technique, movement at target joints, use of target muscles after min verbal, visual, tactile cues.    Decreased L upper trap symptoms with activation of lower trap muscles. Decreased L medial mid scapular area symptoms following scapular retraction exercises.         PT Long Term Goals -  12/15/15 1936      PT LONG TERM GOAL #1   Title Patient will have a decrease in neck pain to 5/10 or less at worst to promote ability to perform work related tasks.   Baseline 10/10 neck pain at worst.    Time 6   Period Weeks   Status New     PT LONG TERM GOAL #2   Title Patient will be able to perform cervical AROM all planes with minimal to no complain of neck pain to promote ability to move his head.    Time 6   Period Weeks   Status New     PT LONG TERM GOAL #3   Title Patient will be able to tolerate performing work related duties for at least 2 hours without increase in neck pain to promote ability perform his job more comfortably.    Time 6   Period Weeks   Status New                Plan - 12/31/15 1156    Clinical Impression Statement Decreased L upper trap symptoms with activation of lower trap muscles. Decreased L medial mid scapular area symptoms following scapular retraction exercises.    Rehab Potential Good   Clinical Impairments Affecting Rehab Potential Chronicity of condition, prolonged position in cervical flexion (8-10 hours) at work   PT Frequency 2x / week   PT Duration 6 weeks   PT Treatment/Interventions Manual techniques;Neuromuscular re-education;Therapeutic exercise;Therapeutic activities;Dry needling;Patient/family education;Ultrasound;Electrical Stimulation   PT Next Visit Plan scapular strengthening, cervical strengthening, manual therapy, posture, ergonomics   Consulted and Agree with Plan of Care Patient      Patient will benefit from skilled therapeutic intervention in order to improve the following deficits and impairments:  Pain, Postural dysfunction  Visit Diagnosis: Cervicalgia     Problem List There are no active problems to display for this patient.  Loralyn Freshwater PT, DPT   12/31/2015, 12:25 PM  [facility] PHYSICAL AND SPORTS MEDICINE [address], [place], [address] Phone: [phone]   Fax:  [phone]  Name: Nicholas Mitchell MRN: [id number] Date of Birth: Jul 07, 1970

## 2016-01-06 ENCOUNTER — Ambulatory Visit: Payer: Managed Care, Other (non HMO)

## 2016-01-06 DIAGNOSIS — M542 Cervicalgia: Secondary | ICD-10-CM | POA: Diagnosis not present

## 2016-01-06 NOTE — Patient Instructions (Signed)
   Scapular Retraction: Rowing (Eccentric) - Arms - 45 Degrees (Resistance Band)   You can perform this in sitting or standing.   Hold end of band in each hand. Pull back until elbows are even with trunk. Keep elbows out from sides at 45, thumbs up.  Keep shoulder blades down   Use __blue______ resistance band. _20__ reps per set, _2_ sets per day. Copyright  VHI. All rights reserved.

## 2016-01-06 NOTE — Therapy (Signed)
[facility] [address] [place], [place], [address] Phone: [phone]   Fax:  [phone]  Physical Therapy Treatment  Patient Details  Name: Nicholas Mitchell MRN: [id number] Date of Birth: 05/19/1970 Referring Provider: Aundria MemsSheikh Tejan-Sei, MD  Encounter Date: 01/06/2016      PT End of Session - 01/06/16 0809    Visit Number 7   Number of Visits 13   Date for PT Re-Evaluation 01/29/16   PT Start Time 0810  pt arrived late   PT Stop Time 0842   PT Time Calculation (min) 32 min   Activity Tolerance Patient tolerated treatment well   Behavior During Therapy [facility] for tasks assessed/performed      Past Medical History:  Diagnosis Date  . Allergy   . Anxiety   . Diabetes mellitus without complication (HCC)   . Irritable bowel syndrome   . Kidney disease     Past Surgical History:  Procedure Laterality Date  . kidney stone removal      There were no vitals filed for this visit.      Subjective Assessment - 01/06/16 0811    Subjective To days is a bad day. Did not work yesterday but working today. Was bad during the weekend. Does not know why. 6/10 L upper trap pain currently.    Pertinent History Neck pain. Pt works at [employer] and is in a constant position in which he is looking down, working on [employer] for 8-10 hours. Has had neck pain for about 2 years.  Denies bilateral UE paresthesias. Pt is L hand dominant.  Had an x-ray for his neck from his chirpractor which revealed decreased curvature at his neck.  Feels pain in his L neck and L gastroc. Had pain medication which did not help.  Had chiripractic treatment which helped him bend his head to the L.    Patient Stated Goals Patient expresses desire to get better.    Currently in Pain? Yes   Pain Score 6    Pain Onset More than a month ago                                 PT Education - 01/06/16 0842    Education provided Yes   Education Details ther-ex, HEP   Person(s) Educated Patient   Methods Explanation;Demonstration;Tactile cues;Verbal cues;Handout   Comprehension Verbalized understanding;Returned demonstration        Objectives  Manual therapy:   STM to L and R UT muscle area  Decreased symptoms   There-ex  Directed patient with Chin tucks with towel roll behind L head to promote L posterior cervical paraspinal muscle use 10x2 with 5 second holds   No bilateral UT muscle pain afterwards. Reviewed and given as part of his HEP. Pt demonstrated and verbalized understanding.   Bilateral scapular retraction resisting blue band 20x2  No L mid medial scapular pain afterwards. Reviewed and given as part of his HEP. Pt demonstrated and verbalized understanding.    Standing bilateral shoulder ER with scapular retraction resisting yellow band             10x3  Bilateral [facility]ap[facility] retraction at [facility] resisting plate  plate 20 for 10U20x   Improved exercise technique, movement at target joints, use of target muscles after min to mod verbal, visual, tactile cues.     No bilateral UT pain after STM to muscles,  chin tucks targeting L posterior cervical muscles and no L medial mid scapular pain after activation of scapular retractor muscles.          PT Long Term Goals - 12/15/15 1936      PT LONG TERM GOAL #1   Title Patient will have a decrease in neck pain to 5/10 or less at worst to promote ability to perform work related tasks.   Baseline 10/10 neck pain at worst.    Time 6   Period Weeks   Status New     PT LONG TERM GOAL #2   Title Patient will be able to perform cervical AROM all planes with minimal to no complain of neck pain to promote ability to move his head.    Time 6   Period Weeks   Status New     PT LONG TERM GOAL #3   Title Patient will be able to tolerate performing work related duties for at least 2 hours without increase in neck pain to promote ability perform his  job more comfortably.    Time 6   Period Weeks   Status New               Plan - 01/06/16 0810    Clinical Impression Statement No bilateral UT pain after STM to muscles, chin tucks targeting L posterior cervical muscles and no L medial mid scapular pain after activation of scapular retractor muscles.    Rehab Potential Good   Clinical Impairments Affecting Rehab Potential Chronicity of condition, prolonged position in cervical flexion (8-10 hours) at work   PT Frequency 2x / week   PT Duration 6 weeks   PT Treatment/Interventions Manual techniques;Neuromuscular re-education;Therapeutic exercise;Therapeutic activities;Dry needling;Patient/family education;Ultrasound;Electrical Stimulation   PT Next Visit Plan scapular strengthening, cervical strengthening, manual therapy, posture, ergonomics   Consulted and Agree with Plan of Care Patient      Patient will benefit from skilled therapeutic intervention in order to improve the following deficits and impairments:  Pain, Postural dysfunction  Visit Diagnosis: Cervicalgia     Problem List There are no active problems to display for this patient.    Loralyn FreshwaterMiguel Melody Savidge PT, DPT  01/06/2016, 8:35 PM  [place] [facility] [address] [place], [place], [address] Phone: [phone]   Fax:  [phone]  Name: Nicholas Mitchell MRN: [id number] Date of Birth: 12/09/1970

## 2016-01-12 ENCOUNTER — Ambulatory Visit: Payer: Managed Care, Other (non HMO) | Attending: Internal Medicine

## 2016-01-12 DIAGNOSIS — M542 Cervicalgia: Secondary | ICD-10-CM | POA: Insufficient documentation

## 2016-01-12 NOTE — Therapy (Signed)
[facility] [address] [place], [place], [address] Phone: [phone]   Fax:  [phone]  Physical Therapy Treatment  Patient Details  Name: Nicholas Mitchell Swoveland MRN: [id number] Date of Birth: 03/02/1971 Referring Provider: Aundria MemsSheikh Tejan-Sei, MD  Encounter Date: 01/12/2016      PT End of Session - 01/12/16 1305    Visit Number 8   Number of Visits 13   Date for PT Re-Evaluation 01/29/16   PT Start Time 1306   PT Stop Time 1338   PT Time Calculation (min) 32 min   Activity Tolerance Patient tolerated treatment well   [facility]      Past Medical History:  Diagnosis Date  . Allergy   . Anxiety   . Diabetes mellitus without complication (HCC)   . Irritable bowel syndrome   . Kidney disease     Past Surgical History:  Procedure Laterality Date  . kidney stone removal      There were no vitals filed for this visit.      Subjective Assessment - 01/12/16 1307    Subjective No neck pain. Had pain this past weekend but on and off. Did not take any pain medicine. 3/10 neck pain at most for the past 3 days.    Pertinent History Neck pain. Pt works at [employer] and is in a constant position in which he is looking down, working on [employer] for 8-10 hours. Has had neck pain for about 2 years.  Denies bilateral UE paresthesias. Pt is L hand dominant.  Had an x-ray for his neck from his chirpractor which revealed decreased curvature at his neck.  Feels pain in his L neck and L gastroc. Had pain medication which did not help.  Had chiripractic treatment which helped him bend his head to the L.    Patient Stated Goals Patient expresses desire to get better.    Currently in Pain? No/denies   Pain Score 0-No pain   Pain Onset More than a month ago                                 PT Education - 01/12/16 1319    Education provided Yes   Education Details  ther-ex   [employer] HotelsPerson(s) Educated Patient   Methods Explanation;Demonstration;Tactile cues;Verbal cues   Comprehension Returned demonstration;Verbalized understanding        Objectives   There-ex  Directed patient with supine open books 10x Seated manually resisted chin tucks targeting the L posterior cervical muscles 10x5 seconds for 3 sets  Seated chin tucks with gentle scapular retraction bilaterallly with gentle manual perturbation from PT 5x10 seconds for 2 sets  Seated manually resisted L cervical rotation isometrics 10x3 with 5 second holds. Good L posterior cervical muscle activation  Bilateral scapular retraction at OMEGA machine resistingplate 20 for 10x2  Standing chin tucks with towel roll behind L head for posterior L cervical spine activation. 8x5 seconds (to review per pt question)  Gave green T-band for pt to perform rows at home if blue band is too strong and pt starts to compensate by shrugging shoulders up. Pt demonstrated and verbalized understanding.    Improved exercise technique, movement at target joints, use of target muscles after min to mod verbal, visual, tactile cues.     Pt demonstrates increased R posterior cervical muscle tone compared to L. Worked on increasing use  of L posterior cervical muscles. Pt tolerated session well without aggravation of symptoms. Cues needed to decrease bilateral shoulder shrug compensation with Omega row exercise. Overall, patient progressing very well towards goal of decreased neck pain.            PT Long Term Goals - 12/15/15 1936      PT LONG TERM GOAL #1   Title Patient will have a decrease in neck pain to 5/10 or less at worst to promote ability to perform work related tasks.   Baseline 10/10 neck pain at worst.    Time 6   Period Weeks   Status New     PT LONG TERM GOAL #2   Title Patient will be able to perform cervical AROM all planes with minimal to no complain of neck pain to promote ability to  move his head.    Time 6   Period Weeks   Status New     PT LONG TERM GOAL #3   Title Patient will be able to tolerate performing work related duties for at least 2 hours without increase in neck pain to promote ability perform his job more comfortably.    Time 6   Period Weeks   Status New               Plan - 01/12/16 1322    Clinical Impression Statement Pt demonstrates increased R posterior cervical muscle tone compared to L. Worked on increasing use of L posterior cervical muscles. Pt tolerated session well without aggravation of symptoms. Cues needed to decrease bilateral shoulder shrug compensation with Omega row exercise. Overall, patient progressing very well towards goal of decreased neck pain.    Rehab Potential Good   Clinical Impairments Affecting Rehab Potential Chronicity of condition, prolonged position in cervical flexion (8-10 hours) at work   PT Frequency 2x / week   PT Duration 6 weeks   PT Treatment/Interventions Manual techniques;Neuromuscular re-education;Therapeutic exercise;Therapeutic activities;Dry needling;Patient/family education;Ultrasound;Electrical Stimulation   PT Next Visit Plan scapular strengthening, cervical strengthening, manual therapy, posture, ergonomics   Consulted and Agree with Plan of Care Patient      Patient will benefit from skilled therapeutic intervention in order to improve the following deficits and impairments:  Pain, Postural dysfunction  Visit Diagnosis: Cervicalgia     Problem List There are no active problems to display for this patient.   Loralyn FreshwaterMiguel Loyal Holzheimer PT, DPT   01/12/2016, 7:07 PM  [facility] [address] [place], [place], [address] Phone: [phone]   Fax:  [phone]  Name: Nicholas Mitchell Barkan MRN: [id number] Date of Birth: 04/18/1970

## 2016-01-19 ENCOUNTER — Ambulatory Visit: Payer: Managed Care, Other (non HMO)

## 2016-01-19 DIAGNOSIS — M542 Cervicalgia: Secondary | ICD-10-CM

## 2016-01-19 NOTE — Therapy (Signed)
[facility] [address] [place], [place], [address] Phone: [phone]   Fax:  [phone]  Physical Therapy Treatment  Patient Details  Name: Nicholas Mitchell MRN: [id number] Date of Birth: 09/16/1970 Referring Provider: Aundria MemsSheikh Tejan-Sei, MD  Encounter Date: 01/19/2016      PT End of Session - 01/19/16 1655    Visit Number 9   Number of Visits 13   Date for PT Re-Evaluation 01/29/16   PT Start Time 1655   PT Stop Time 1740   PT Time Calculation (min) 45 min   Activity Tolerance Patient tolerated treatment well   Behavior [facility] Therapy [facility] assessed/performed      Past Medical History:  Diagnosis Date  . Allergy   . Anxiety   . Diabetes mellitus without complication (HCC)   . Irritable bowel syndrome   . Kidney disease     Past Surgical History:  Procedure Laterality Date  . kidney stone removal      There were no vitals filed for this visit.      Subjective Assessment - 01/19/16 1656    Subjective Neck is better. Feel neck pain bilaterally (UT and scalene area at base of neck). 6/10 neck pain currently. Worked yesterday. The neck exercise at the wall helps.  The headaches are better as well and is better able to sleep after work (after doing his exercises).    Pertinent History Neck pain. Pt works at [employer] and is in a constant position in which he is looking down, working on [employer] for 8-10 hours. Has had neck pain for about 2 years.  Denies bilateral UE paresthesias. Pt is L hand dominant.  Had an x-ray for his neck from his chirpractor which revealed decreased curvature at his neck.  Feels pain in his L neck and L gastroc. Had pain medication which did not help.  Had chiripractic treatment which helped him bend his head to the L.    Patient Stated Goals Patient expresses desire to get better.    Currently in Pain? Yes   Pain Score 6    Pain Onset More than a month ago                                  PT Education - 01/19/16 1709    Education provided Yes   Education Details ther-ex   [employer]elsPerson(s) Educated Patient   Methods Explanation;Demonstration;Tactile cues;Verbal cues   Comprehension Returned demonstration;Verbalized understanding        Objectives  Manual therapy  STM L UT and scalene area at base of neck   0/10 neck pain afterwards.   There-ex  Directed patient with seated manually resisted chin tuck targeting L posterior cervical muscles 10x10 seconds for 2 sets  Lower trap raise   R UE in quadruped/bent over 10x   Then in prone 3x   Then seated on chair but resting forehead on forearm 10x2. Less L low back discomfort    L UE in quadruped/bent over 10x   Then seated on chair but resting forehead on forearm 10x2   No neck pain afterwards.  Seated manually resisted L cervical rotation isometrics 10x3 with 5 second holds. Good L posterior cervical muscle activation   Improved exercise technique, movement at target joints, use of target muscles after min to mod verbal, visual, tactile cues.    Decreased neck pain after STM to  UT area and working lower trap muscles. Good L posterior cervical muscle use with exercises.        PT Long Term Goals - 12/15/15 1936      PT LONG TERM GOAL #1   Title Patient will have a decrease in neck pain to 5/10 or less at worst to promote ability to perform work related tasks.   Baseline 10/10 neck pain at worst.    Time 6   Period Weeks   Status New     PT LONG TERM GOAL #2   Title Patient will be able to perform cervical AROM all planes with minimal to no complain of neck pain to promote ability to move his head.    Time 6   Period Weeks   Status New     PT LONG TERM GOAL #3   Title Patient will be able to tolerate performing work related duties for at least 2 hours without increase in neck pain to promote ability perform his job more comfortably.    Time 6    Period Weeks   Status New               Plan - 01/19/16 1714    Clinical Impression Statement Decreased neck pain after STM to UT area and working lower trap muscles. Good L posterior cervical muscle use with exercises.    Rehab Potential Good   Clinical Impairments Affecting Rehab Potential Chronicity of condition, prolonged position in cervical flexion (8-10 hours) at work   PT Frequency 2x / week   PT Duration 6 weeks   PT Treatment/Interventions Manual techniques;Neuromuscular re-education;Therapeutic exercise;Therapeutic activities;Dry needling;Patient/family education;Ultrasound;Electrical Stimulation   PT Next Visit Plan scapular strengthening, cervical strengthening, manual therapy, posture, ergonomics   Consulted and Agree with Plan of Care Patient      Patient will benefit from skilled therapeutic intervention in order to improve the following deficits and impairments:  Pain, Postural dysfunction  Visit Diagnosis: Cervicalgia     Problem List There are no active problems to display for this patient.   Loralyn FreshwaterMiguel Laygo PT, DPT   01/19/2016, 6:10 PM   [facility] [address] [place], [place], [address] Phone: [phone]   Fax:  [phone]  Name: Nicholas Mitchell MRN: [id number] Date of Birth: 01/15/1971

## 2016-01-22 ENCOUNTER — Ambulatory Visit: Payer: Managed Care, Other (non HMO)

## 2016-01-26 ENCOUNTER — Ambulatory Visit: Payer: Managed Care, Other (non HMO)

## 2016-01-26 DIAGNOSIS — M542 Cervicalgia: Secondary | ICD-10-CM | POA: Diagnosis not present

## 2016-01-26 NOTE — Patient Instructions (Signed)
Neck Side-Bending    Tilt right ear toward right shoulder. Hold ___10_ seconds. Slowly return to starting position.  Repeat 5 times.   Perform after every hour of sitting.   Copyright  VHI. All rights reserved.

## 2016-01-26 NOTE — Therapy (Signed)
[facility] [address] [place], [address] Phone: [phone]   Fax:  [phone]  Physical Therapy Treatment  Patient Details  Name: Nicholas Mitchell MRN: [id number] Date of Birth: 06/29/1970 Referring Provider: Aundria Mems, MD  Encounter Date: 01/26/2016      PT End of Session - 01/26/16 1654    Visit Number 10   Number of Visits 13   Date for PT Re-Evaluation 01/29/16   PT Start Time 1654   PT Stop Time 1746   PT Time Calculation (min) 52 min   Activity Tolerance Patient tolerated treatment well   Behavior During Therapy [facility] for tasks assessed/performed      Past Medical History:  Diagnosis Date  . Allergy   . Anxiety   . Diabetes mellitus without complication (HCC)   . Irritable bowel syndrome   . Kidney disease     Past Surgical History:  Procedure Laterality Date  . kidney stone removal      There were no vitals filed for this visit.      Subjective Assessment - 01/26/16 1655    Subjective Yesterday and this morning, pt has had pain in L neck and L chest area. Feels better when he would squeeze his shoulder blades together and extend his neck but would feel posterior head and L eye discomfort (sub occipital nerve). Feels better when he tucks his chin in.  No pain currently. However felt pain 24 minutes ago (8-9/10) and was angry and stressed about it.  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    Pertinent History Neck pain. Pt works at [employer] and is in a constant position in which he is looking down, working on [employer] for 8-10 hours. Has had neck pain for about 2 years.  Denies bilateral UE paresthesias. Pt is L hand dominant.  Had an x-ray for  his neck from his chirpractor which revealed decreased curvature at his neck.  Feels pain in his L neck and L gastroc. Had pain medication which did not help.  Had chiripractic treatment which helped him bend his head to the L.    Patient Stated Goals Patient expresses desire to get better.    Currently in Pain? No/denies   Pain Score 0-No pain   Pain Onset More than a month ago                                 PT Education - 01/26/16 1723    Education provided Yes   Education Details ther-ex   [employer]) Educated Patient   Methods Explanation;Demonstration;Tactile cues;Verbal cues   Comprehension Returned demonstration;Verbalized understanding        Objectives  There-ex  Recommended for pt to perform chin tucks instead of cervical extension to decrease pressure to suboccipital nerve.   L UE neural tension  Radial nerve   Reproduced symptoms with L cervical side bend, decreased symptoms with R cervical side bend.   Standing R shoulder extension with scapular retraction resisting green band 10x5 seconds, then 3x5 seconds  L UE symptoms around C6 dermatome, decreases with rest.  standing L shoulder extension with scapular retraction resisting green band 10x3 with 5 second holds.   No L UE symptoms except that at the 1st web space which decreased with increased repetition up until 3rd set of 10 which caused an increase in L UE symptoms.    L Lower trap raise,    seated on chair but resting forehead on forearm 10x2 with 5 second holds    No decrease in symptoms today  Pt was recommended to continue working on his scapular retraction exercise (secondary to pt stopping it even though it helped before). Pt verbalized understanding.   Pt was recommended to lower his computer screen at work to help decrease cervical extension while typing. Pt verbalized understanding.  R cervical side bend to stretch L neck to decrease pain 10 seconds x 5  Pt was  recommended to perform every hour at work to help decrease symptoms. Pt demonstrated and verbalized understanding.    Improved exercise technique, movement at target joints, use of target muscles after mod verbal, visual, tactile cues.      Palpation: slight R cervical rotation around C6 area. L radial nerve neural tension. L UE symptom reproduction with L cervical side bend. Decreased symptoms with R cervical side bending. Pt demnonstrates possible need for continued stability exercises as well secondary to no joint stiffness observed and full ROM.         PT Long Term Goals - 12/15/15 1936      PT LONG TERM GOAL #1   Title Patient will have a decrease in neck pain to 5/10 or less at worst to promote ability to perform work related tasks.   Baseline 10/10 neck pain at worst.    Time 6   Period Weeks   Status New     PT LONG TERM GOAL #2   Title Patient will be able to perform cervical AROM all planes with minimal to no complain of neck pain to promote ability to move his head.    Time 6   Period Weeks   Status New     PT LONG TERM GOAL #3   Title Patient will be able to tolerate performing work related duties for at least 2 hours without increase in neck pain to promote ability perform his job more comfortably.    Time 6   Period Weeks   Status New               Plan - 01/26/16 1726    Clinical Impression Statement Palpation: slight R cervical rotation around C6 area. L radial nerve neural tension. L UE symptom reproduction with L cervical side bend. Decreased symptoms with R cervical side bending. Pt demnonstrates possible need for continued stability exercises as well secondary to no joint stiffness observed and full ROM.    Rehab Potential Good   Clinical Impairments Affecting Rehab Potential Chronicity of condition, prolonged position in cervical flexion (8-10 hours) at work   PT Frequency 2x / week   PT Duration 6 weeks   PT Treatment/Interventions Manual  techniques;Neuromuscular re-education;Therapeutic exercise;Therapeutic activities;Dry needling;Patient/family education;Ultrasound;Electrical Stimulation   PT Next Visit Plan scapular strengthening, cervical strengthening, manual therapy, posture, ergonomics   Consulted and Agree with Plan of Care Patient      Patient will benefit from skilled therapeutic intervention in order to improve the following deficits and impairments:  Pain, Postural dysfunction  Visit Diagnosis: Cervicalgia     Problem List There are no active problems to display for this patient.  Loralyn FreshwaterMiguel Yahya Boldman PT, DPT   01/26/2016, 7:04 PM  [facility] [address] [place], [place], [address] Phone: [phone]   Fax:  [phone]  Name: Nicholas Mitchell MRN: [id number] Date of Birth: 08/31/1970

## 2016-01-28 ENCOUNTER — Ambulatory Visit: Payer: Managed Care, Other (non HMO)

## 2016-01-28 DIAGNOSIS — M542 Cervicalgia: Secondary | ICD-10-CM

## 2016-01-28 NOTE — Therapy (Signed)
[facility] [address] [place], [address] Phone: [phone]   Fax:  [phone]  Physical Therapy Treatment  Patient Details  Name: Nicholas Mitchell MRN: [id number] Date of Birth: Dec 31, 1970 Referring Provider: Azzie Roup, MD  Encounter Date: 01/28/2016         PT End of Session - 01/28/16 1650    Visit Number 11   Number of Visits 25   Date for PT Re-Evaluation 02/26/16   PT Start Time 8185  pt arrived late   PT Stop Time 1736   PT Time Calculation (min) 46 min   Activity Tolerance Patient tolerated treatment well   Behavior During Therapy [facility] for tasks assessed/performed      Past Medical History:  Diagnosis Date  . Allergy   . Anxiety   . Diabetes mellitus without complication ([place])   . Irritable bowel syndrome   . Kidney disease     Past Surgical History:  Procedure Laterality Date  . kidney stone removal      There were no vitals filed for this visit.      Subjective Assessment - 01/28/16 1652    Subjective Neck is not bad. Worked from yesterday to today. Pain is not more than 4/10 currently. 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 Yesterday, he was standing and looking down for his work station (pain at that work station for his neck is better overall, "very very good;"  able work at that station for 3 hours without his neck bothering him). 4/10 neck pain at most for the past 7 days.      Pt adds doing the T-band row exercise again. The shoulder squeezes help with L pectoralis pain at the sitting bench station at work. Pt states that he is progressing towards his goals for his neck. Sees improvement which helps his mood. Wants to continue.    Pertinent History Neck pain. Pt works at [employer] and is in a constant position in which he is looking down, working on [employer] for 8-10 hours. Has had neck  pain for about 2 years.  Denies bilateral UE paresthesias. Pt is L hand dominant.  Had an x-ray for his neck from his chirpractor which revealed decreased curvature at his neck.  Feels pain in his L neck and L gastroc. Had pain medication which did not help.  Had chiripractic treatment which helped him bend his head to the L.    Patient Stated Goals Patient expresses desire to get better.    Currently in Pain? Yes   Pain Score 4    Pain Onset More than a month ago            [facility] PT Assessment - 01/28/16 1658      AROM   Cervical Flexion full no neck pain currently (had pain 2 days)   Cervical Extension full, no pain   Cervical - Right Side [facility] with a little bit of L lateral upper cervical discomfort (around C3 area)   Cervical - Left Side [facility] with L lateral neck pain/discomfort (UT/distal scalene area)   Cervical - Right Rotation full, no pain   Cervical - Left Rotation full, with L latearl neck pain/discomfort around the UT and distal scalene area.  PT Education - 01/28/16 1759    Education provided Yes   Education Details ther-ex, HEP, plan of care   Person(s) Educated Patient   Methods Explanation;Demonstration;Tactile cues;Verbal cues;Handout   Comprehension Returned demonstration;Verbalized understanding         Objectives   There-ex  Reviewed progress/current status with PT goals.   Reviewed plan of care: 2x/week for 4 weeks   L first rib stretch 10x10 seconds for 2 sets  No neck pain afterwards at rest   Decreased neck pain with AROM    Reviewed and given as part of his HEP. Pt demonstrated and verbalized understanding. Handout provided.   Seated manually resisted chin tuck targeting L posterior cervical muscles 10x10 seconds for 2 sets  Pt was recommended to sit up more straight on the sitting bench to decrease L shoulder shrug and L cervical side bend. Pt demonstrated and verbalized  understanding.    Improved exercise technique, movement at target joints, use of target muscles after mod verbal, visual, tactile cues.    Pt demonstrates overall decreased neck pain since initial evaluation. Improved tolerance with working at his standing station. Symptoms increase when working at the sitting station. Recommended proper sitting posture in addition to L first rib/scalene stretch to help address aggravating factor. Decreased neck pain and L UE symptoms today with addition of L first rib/scalene stretch exercise to help decrease muscle tension around the thoracic outlet area on the L side. Pt still demonstrates neck pain, difficulty with cervical AROM and difficulty performing functional tasks related to work and would benefit from continued skilled physical therapy services to address the aforementioned deficits.        PT Long Term Goals - 01/28/16 1704      PT LONG TERM GOAL #1   Title Patient will have a decrease in neck pain to 5/10 or less at worst to promote ability to perform work related tasks.   Baseline 10/10 neck pain at worst. 4/10 neck pain at most for the past 7 days (01/28/2016)    Time 6   Period Weeks   Status Achieved     PT LONG TERM GOAL #2   Title Patient will be able to perform cervical AROM all planes with minimal to no complain of neck pain to promote ability to move his head.    Time 6   Period Weeks   Status Partially Met     PT LONG TERM GOAL #3   Title Patient will be able to tolerate performing work related duties for at least 2 hours without increase in neck pain to promote ability perform his job more comfortably.    Baseline 3 hour tolerance (improved) for the standing bench. Difficulty with the sitting bench.    Time 6   Period Weeks   Status Partially Met               Plan - 01/28/16 1800    Clinical Impression Statement Pt demonstrates overall decreased neck pain since initial evaluation. Improved tolerance with working at  his standing station. Symptoms increase when working at the sitting station. Recommended proper sitting posture in addition to L first rib/scalene stretch to help address aggravating factor. Decreased neck pain and L UE symptoms today with addition of L first rib/scalene stretch exercise to help decrease muscle tension around the thoracic outlet area on the L side. Pt still demonstrates neck pain, difficulty with cervical AROM and difficulty performing functional tasks related to work and would benefit from  continued skilled physical therapy services to address the aforementioned deficits.    Rehab Potential Good   Clinical Impairments Affecting Rehab Potential Chronicity of condition, prolonged position in cervical flexion (8-10 hours) at work   PT Frequency 2x / week   PT Duration 4 weeks   PT Treatment/Interventions Manual techniques;Neuromuscular re-education;Therapeutic exercise;Therapeutic activities;Dry needling;Patient/family education;Ultrasound;Electrical Stimulation   PT Next Visit Plan scapular strengthening, cervical strengthening, manual therapy, posture, ergonomics   Consulted and Agree with Plan of Care Patient      Patient will benefit from skilled therapeutic intervention in order to improve the following deficits and impairments:  Pain, Postural dysfunction  Visit Diagnosis: Cervicalgia - Plan: PT plan of care cert/re-cert     Problem List There are no active problems to display for this patient.  Joneen Boers PT, DPT   01/28/2016, 7:01 PM  [place] [facility] [address], [place], [address] Phone: [phone]   Fax:  [phone]  Name: Nicholas Mitchell MRN: [id number] Date of Birth: 1970/09/28

## 2016-01-28 NOTE — Patient Instructions (Signed)
  Gave L first rib stretch with R cervical side bend 10x10 seconds for 3 sets daily as part of his HEP. Handout provided. Pt demonstrated and verbalized understanding.

## 2016-02-02 ENCOUNTER — Ambulatory Visit: Payer: Managed Care, Other (non HMO)

## 2016-02-02 DIAGNOSIS — M542 Cervicalgia: Secondary | ICD-10-CM

## 2016-02-02 NOTE — Therapy (Signed)
[place] [facility] [address] [place], [address] Phone: [phone]   Fax:  [phone]  Physical Therapy Treatment  Patient Details  Name: Nicholas Mitchell MRN: [id number] Date of Birth: 1970/04/21 Referring Provider: Azzie Roup, MD  Encounter Date: 02/02/2016      PT End of Session - 02/02/16 1659    Visit Number 12   Number of Visits 25   Date for PT Re-Evaluation 02/26/16   PT Start Time 1659  pt arrived late   PT Stop Time 1748   PT Time Calculation (min) 49 min   Activity Tolerance Patient tolerated treatment well   Behavior During Therapy [facility] for tasks assessed/performed      Past Medical History:  Diagnosis Date  . Allergy   . Anxiety   . Diabetes mellitus without complication ([place])   . Irritable bowel syndrome   . Kidney disease     Past Surgical History:  Procedure Laterality Date  . kidney stone removal      There were no vitals filed for this visit.      Subjective Assessment - 02/02/16 1701    Subjective Neck is good. Better. Just a little bit of pain. The exercise/stretch with the towel helps. Better able to work at the sitting station (working on good posture).    Pertinent History Neck pain. Pt works at [employer] and is in a constant position in which he is looking down, working on [employer] for 8-10 hours. Has had neck pain for about 2 years.  Denies bilateral UE paresthesias. Pt is L hand dominant.  Had an x-ray for his neck from his chirpractor which revealed decreased curvature at his neck.  Feels pain in his L neck and L gastroc. Had pain medication which did not help.  Had chiripractic treatment which helped him bend his head to the L.    Patient Stated Goals Patient expresses desire to get better.    Currently in Pain? Yes   Pain Score --  no pain level provided   Pain Onset More than a month ago                                 PT Education -  02/02/16 1708    Education provided Yes   Education Details ther-ex   [employer]) Educated Patient   Methods Explanation;Demonstration;Tactile cues;Verbal cues   Comprehension Returned demonstration;Verbalized understanding        Objectives     There-ex               Directed patient with L Lower trap raise,                          seated on chair but resting forehead on forearm 10x2 with 5 second holds   L first rib stretch 10x10 seconds. Min cues to decrease shoulder shrug  Relaxed L UT muscles per pt.    Bilateral shoulder ER resisting yellow band with scapular retraction 10x3  Bilateral anterior arm discomfort, eases with rest    Standing triceps extension resisting yellow band 10x each UE  Then resisting red band 10x each UE  Then resisting green band 10x each UE    Biceps flexion resisting 5 lbs 10x3 to help promote radial nerve mobility   Bilateral shoulder ER resisting yellow band 10x3 after biceps and triceps exercises  10x3. No anterior arm symptoms  L cervical eccentric side bending with gentle manual resistance to R neck 5x3  Decreased neck pain with L cervical side bending afterwards  Upgraded rows HEP to black band   Improved exercise technique, movement at target joints, use of target muscles after min to mod verbal, visual, tactile cues.      Manual therapy   STM L scalene and UT area  Caudal glide L first rib grade 3   No change in L neck symptoms   Improved ability to perform L cervical side bending with gentle eccentric resistance applied to contralateral side. Decreased L cervical pain/symptoms with L first rib stretch with R cervical side bending. Decreased bilateral anterior arm discomfort with shoulder ER exercise following activation of biceps and triceps muscles.         PT Long Term Goals - 01/28/16 1704      PT LONG TERM GOAL #1   Title Patient will have a decrease in neck pain to 5/10 or less at worst to promote  ability to perform work related tasks.   Baseline 10/10 neck pain at worst. 4/10 neck pain at most for the past 7 days (01/28/2016)    Time 6   Period Weeks   Status Achieved     PT LONG TERM GOAL #2   Title Patient will be able to perform cervical AROM all planes with minimal to no complain of neck pain to promote ability to move his head.    Time 6   Period Weeks   Status Partially Met     PT LONG TERM GOAL #3   Title Patient will be able to tolerate performing work related duties for at least 2 hours without increase in neck pain to promote ability perform his job more comfortably.    Baseline 3 hour tolerance (improved) for the standing bench. Difficulty with the sitting bench.    Time 6   Period Weeks   Status Partially Met               Plan - 02/02/16 1658    Clinical Impression Statement Improved ability to perform L cervical side bending with gentle eccentric resistance applied to contralateral side. Decreased L cervical pain/symptoms with L first rib stretch with R cervical side bending. Decreased bilateral anterior arm discomfort with shoulder ER exercise following activation of biceps and triceps muscles.    Rehab Potential Good   Clinical Impairments Affecting Rehab Potential Chronicity of condition, prolonged position in cervical flexion (8-10 hours) at work   PT Frequency 2x / week   PT Duration 4 weeks   PT Treatment/Interventions Manual techniques;Neuromuscular re-education;Therapeutic exercise;Therapeutic activities;Dry needling;Patient/family education;Ultrasound;Electrical Stimulation   PT Next Visit Plan scapular strengthening, cervical strengthening, manual therapy, posture, ergonomics   Consulted and Agree with Plan of Care Patient      Patient will benefit from skilled therapeutic intervention in order to improve the following deficits and impairments:  Pain, Postural dysfunction  Visit Diagnosis: Cervicalgia     Problem List There are no  active problems to display for this patient.   Joneen Boers PT, DPT  02/02/2016, 6:45 PM  [place] [facility] [address] [place], [address] Phone: [phone]   Fax:  [phone]  Name: Nicholas Mitchell MRN: [id number] Date of Birth: 01/08/1971

## 2016-02-04 ENCOUNTER — Ambulatory Visit: Payer: Managed Care, Other (non HMO)

## 2016-02-04 DIAGNOSIS — M542 Cervicalgia: Secondary | ICD-10-CM | POA: Diagnosis not present

## 2016-02-04 NOTE — Patient Instructions (Addendum)
  Gave lower trap raise, seated on chair but resting forehead on forearm 10x2 with 5 second holds as part of his HEP daily. Pt demonstrated and verbalized understanding.

## 2016-02-04 NOTE — Therapy (Signed)
[place] [facility] [address] [place], [address] Phone: [phone]   Fax:  [phone]  Physical Therapy Treatment  Patient Details  Name: Nicholas Mitchell MRN: [id number] Date of Birth: 12-03-1970 Referring Provider: Azzie Roup, MD  Encounter Date: 02/04/2016      PT End of Session - 02/04/16 1655    Visit Number 13   Number of Visits 25   Date for PT Re-Evaluation 02/26/16   PT Start Time 1655  pt arrived late   PT Stop Time 1740   PT Time Calculation (min) 45 min   Activity Tolerance Patient tolerated treatment well   Behavior During Therapy [facility] for tasks assessed/performed      Past Medical History:  Diagnosis Date  . Allergy   . Anxiety   . Diabetes mellitus without complication ([place])   . Irritable bowel syndrome   . Kidney disease     Past Surgical History:  Procedure Laterality Date  . kidney stone removal      There were no vitals filed for this visit.      Subjective Assessment - 02/04/16 1657    Subjective Neck is good. No propblem but still has pain in in upper shoulder area (bilateral upper trap muscles). 4/10 currently. Does his exercises at work at the standing station. Still has L lateral neck and L shoulder pain but overall better.    Pertinent History Neck pain. Pt works at [employer] and is in a constant position in which he is looking down, working on [employer] for 8-10 hours. Has had neck pain for about 2 years.  Denies bilateral UE paresthesias. Pt is L hand dominant.  Had an x-ray for his neck from his chirpractor which revealed decreased curvature at his neck.  Feels pain in his L neck and L gastroc. Had pain medication which did not help.  Had chiripractic treatment which helped him bend his head to the L.    Patient Stated Goals Patient expresses desire to get better.    Currently in Pain? Yes   Pain Score 4    Pain Onset More than a month ago                                  PT Education - 02/04/16 1707    Education provided Yes   Education Details ther-ex, HEP   Person(s) Educated Patient   Methods Explanation;Demonstration;Tactile cues;Verbal cues   Comprehension Returned demonstration;Verbalized understanding        Objectives     There-ex   Directed patient with R and L lower trap raise,  seated on chair but resting forehead on forearm 10x with 5 second holds   No upper trap pain afterwards  Standing R shoulder extension resisting yellow band 10x2 with 5 second holds. Slight decreased L cervical symptoms at base of neck  Then resisting red band 10x2 with 5 second holds   Supine chin tucks with towel behind L head 10x5 seconds. Cues for scapular retraction  Supine open books 5x. Neural tension felt bilateral UE L > R    Standing triceps extension resisting green band 10x3 each UE            Biceps flexion resisting 5 lbs 10x3 to help promote radial nerve mobility   Bilateral shoulder ER resisting yellow band with scapular retraction 10x3  L cervical eccentric side bending with gentle self  manual resistance to R neck 5x2 to improve ability to perform L cervical side bending.    Improved exercise technique, movement at target joints, use of target muscles after min to mod verbal, visual, tactile cues.     Decreased symptoms with strengthening of the lower trap muscles both sides.           PT Long Term Goals - 01/28/16 1704      PT LONG TERM GOAL #1   Title Patient will have a decrease in neck pain to 5/10 or less at worst to promote ability to perform work related tasks.   Baseline 10/10 neck pain at worst. 4/10 neck pain at most for the past 7 days (01/28/2016)    Time 6   Period Weeks   Status Achieved     PT LONG TERM GOAL #2   Title Patient will be able to perform cervical AROM all planes with minimal to no complain of neck pain to promote  ability to move his head.    Time 6   Period Weeks   Status Partially Met     PT LONG TERM GOAL #3   Title Patient will be able to tolerate performing work related duties for at least 2 hours without increase in neck pain to promote ability perform his job more comfortably.    Baseline 3 hour tolerance (improved) for the standing bench. Difficulty with the sitting bench.    Time 6   Period Weeks   Status Partially Met               Plan - 02/04/16 1712    Clinical Impression Statement Decreased symptoms with strengthening of the lower trap muscles both sides.    Rehab Potential Good   Clinical Impairments Affecting Rehab Potential Chronicity of condition, prolonged position in cervical flexion (8-10 hours) at work   PT Frequency 2x / week   PT Duration 4 weeks   PT Treatment/Interventions Manual techniques;Neuromuscular re-education;Therapeutic exercise;Therapeutic activities;Dry needling;Patient/family education;Ultrasound;Electrical Stimulation   PT Next Visit Plan scapular strengthening, cervical strengthening, manual therapy, posture, ergonomics   Consulted and Agree with Plan of Care Patient      Patient will benefit from skilled therapeutic intervention in order to improve the following deficits and impairments:  Pain, Postural dysfunction  Visit Diagnosis: Cervicalgia     Problem List There are no active problems to display for this patient.   Joneen Boers PT, DPT   02/04/2016, 6:02 PM  [place] [facility] [address] [place], [address] Phone: [phone]   Fax:  [phone]  Name: Nicholas Mitchell MRN: [id number] Date of Birth: [birth year]

## 2016-02-09 ENCOUNTER — Ambulatory Visit: Payer: Managed Care, Other (non HMO) | Attending: Internal Medicine

## 2016-02-09 DIAGNOSIS — M542 Cervicalgia: Secondary | ICD-10-CM | POA: Insufficient documentation

## 2016-02-09 NOTE — Patient Instructions (Signed)
Flexibility: Upper Trapezius Stretch  Look into your right pant pocket.  Use your right hand on your head to gently push down to feel a stretch at your left back of the neck.  Hold __10__ seconds. Repeat ___5_ times per set. Do ___2_ sets per session. Do _2-3___ sessions per day.  Repeat for your other side.    http://orth.exer.us/340   Copyright  VHI. All rights reserved.

## 2016-02-09 NOTE — Therapy (Signed)
[facility] [address] [place], [place], [address] Phone: [phone]   Fax:  [phone]  Physical Therapy Treatment  Patient Details  Name: Nicholas Mitchell MRN: [id number] Date of Birth: 09/30/1970 Referring Provider: Sheikh Tejan-Sei, MD  Encounter Date: 02/09/2016      PT End of Session - 02/09/16 1610    Visit Number 14   Number of Visits 25   Date for PT Re-Evaluation 02/26/16   PT Start Time 1610   PT Stop Time 1655   PT Time Calculation (min) 45 min   Activity Tolerance Patient tolerated treatment well   Behavior During Therapy WFL for tasks assessed/performed      Past Medical History:  Diagnosis Date  . Allergy   . Anxiety   . Diabetes mellitus without complication (HCC)   . Irritable bowel syndrome   . Kidney disease     Past Surgical History:  Procedure Laterality Date  . kidney stone removal      There were no vitals filed for this visit.      Subjective Assessment - 02/09/16 1611    Subjective Neck is good. Just a little bit of pain (L and R base of the neck).   Pertinent History Neck pain. 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 Has had neck pain for about 2 years.  Denies bilateral UE paresthesias. Pt is L hand dominant.  Had an x-ray for his neck from his chirpractor which revealed decreased curvature at his neck.  Feels pain in his L neck and L gastroc. Had pain medication which did not help.  Had chiripractic treatment which helped him bend his head to the L.    Patient Stated Goals Patient expresses desire to get better.    Currently in Pain? Yes   Pain Score 2    Pain Onset More than a month ago            [facility] PT Assessment - 02/09/16 1652      AROM   Cervical Flexion full, no pain   Cervical Extension WFL with neck pain   Cervical - Right Side Bend WFL with some L neck pain   Cervical - Left  Side Bend full with a little bit of pain,    Cervical - Right Rotation full with a little bit of L neck pain    Cervical - Left Rotation full with a little bit of L neck pain.                              PT Education - 02/09/16 1627    Education provided Yes   Education Details ther-ex, HEP   Person(s) Educated Patient   Methods Explanation;Demonstration;Tactile cues;Verbal cues;Handout   Comprehension Returned demonstration;Verbalized understanding        Objectives     There-ex  Directed patient with R and L upper trap stretch 10 seconds x 5 each for 2 sets   Standing L shoulder press downs (for L scapular depression) resisting red band 10x5 seconds for 3 sets  No pain L shoulder/UT area afterwards   R and L lower trap raise over physioball  10x5 seconds each UE for 2 sets  R and L middle trap raise over physioball   10x5 seconds  L cervical eccentric side bending with gentle manual resistance   to R neck 10x2 to improve ability to perform L cervical side bending.   Then with self manual resistance 10x   Cervical AROM: flexion, extension, rotation (R and L), side bend (R and L) 1-2x each way   Improved exercise technique, movement at target joints, use of target muscles after min to mod verbal, visual, tactile cues.    Decreased L cervical pain with UT stretch and L scapular depression exercise. Decreased R cervical pain with R UT stretch. Decreased pain with L cervical side bending following eccentric resistance to R neck during L side bending.          PT Long Term Goals - 01/28/16 1704      PT LONG TERM GOAL #1   Title Patient will have a decrease in neck pain to 5/10 or less at worst to promote ability to perform work related tasks.   Baseline 10/10 neck pain at worst. 4/10 neck pain at most for the past 7 days (01/28/2016)    Time 6   Period Weeks   Status Achieved     PT LONG TERM GOAL #2   Title Patient will be able to  perform cervical AROM all planes with minimal to no complain of neck pain to promote ability to move his head.    Time 6   Period Weeks   Status Partially Met     PT LONG TERM GOAL #3   Title Patient will be able to tolerate performing work related duties for at least 2 hours without increase in neck pain to promote ability perform his job more comfortably.    Baseline 3 hour tolerance (improved) for the standing bench. Difficulty with the sitting bench.    Time 6   Period Weeks   Status Partially Met               Plan - 02/09/16 1628    Clinical Impression Statement Decreased L cervical pain with UT stretch and L scapular depression exercise. Decreased R cervical pain with R UT stretch. Decreased pain with L cervical side bending following eccentric resistance to R neck during L side bending.    Rehab Potential Good   Clinical Impairments Affecting Rehab Potential Chronicity of condition, prolonged position in cervical flexion (8-10 hours) at work   PT Frequency 2x / week   PT Duration 4 weeks   PT Treatment/Interventions Manual techniques;Neuromuscular re-education;Therapeutic exercise;Therapeutic activities;Dry needling;Patient/family education;Ultrasound;Electrical Stimulation   PT Next Visit Plan scapular strengthening, cervical strengthening, manual therapy, posture, ergonomics   Consulted and Agree with Plan of Care Patient      Patient will benefit from skilled therapeutic intervention in order to improve the following deficits and impairments:  Pain, Postural dysfunction  Visit Diagnosis: Cervicalgia     Problem List There are no active problems to display for this patient.  Joneen Boers PT, DPT   02/09/2016, 5:10 PM  [facility] [address], [place], [address] Phone: [phone]   Fax:  [phone]  Name: Nicholas Mitchell MRN: [id number] Date of Birth: [birth year]

## 2016-02-11 ENCOUNTER — Ambulatory Visit (INDEPENDENT_AMBULATORY_CARE_PROVIDER_SITE_OTHER): Payer: Managed Care, Other (non HMO) | Admitting: Urology

## 2016-02-11 ENCOUNTER — Ambulatory Visit
Admission: RE | Admit: 2016-02-11 | Discharge: 2016-02-11 | Disposition: A | Discharge status: Home / Self Care | Payer: Managed Care, Other (non HMO) | Source: Ambulatory Visit | Attending: Urology | Admitting: Urology

## 2016-02-11 VITALS — BP 127/78 | HR 75 | Ht 76.38 in | Wt 177.0 lb

## 2016-02-11 DIAGNOSIS — R3915 Urgency of urination: Secondary | ICD-10-CM | POA: Diagnosis not present

## 2016-02-11 DIAGNOSIS — R350 Frequency of micturition: Secondary | ICD-10-CM | POA: Insufficient documentation

## 2016-02-11 DIAGNOSIS — Z87442 Personal history of urinary calculi: Secondary | ICD-10-CM | POA: Insufficient documentation

## 2016-02-11 LAB — URINALYSIS, COMPLETE
Bilirubin, UA: NEGATIVE
GLUCOSE, UA: NEGATIVE
Ketones, UA: NEGATIVE
Leukocytes, UA: NEGATIVE
Nitrite, UA: NEGATIVE
PROTEIN UA: NEGATIVE
RBC, UA: NEGATIVE
Specific Gravity, UA: 1.015 (ref 1.005–1.030)
UUROB: 0.2 mg/dL (ref 0.2–1.0)
pH, UA: 8.5 — ABNORMAL HIGH (ref 5.0–7.5)

## 2016-02-11 LAB — MICROSCOPIC EXAMINATION
BACTERIA UA: NONE SEEN
Epithelial Cells (non renal): NONE SEEN /hpf (ref 0–10)
RBC, UA: NONE SEEN /hpf (ref 0–?)

## 2016-02-11 LAB — BLADDER SCAN AMB NON-IMAGING

## 2016-02-11 NOTE — Progress Notes (Signed)
02/11/2016 8:24 PM   Doristine Sectionarig Gittens 01/29/1971 [id number]  Referring provider: Sherron MondayS Ahmed Tejan-Sie, MD [address] [place], [place] [address]   Chief Complaint  Patient presents with  . Urinary Frequency    HPI: [age] year old male who returns today is onset of urinary frequency, urgency over the past 4-6 weeks. He also has some mild left lower quadrant pain. He states that this feels similar to when he had his ureteral stent first kidney stone.  He denies any dysuria, hematuria,  no UTIs. He denies any flank pain other than the mild left lower quadrant pain. No testicular discomfort.  He does endorses a good urinary stream. No sensation of incomplete bladder emptying. No frequency or nocturia.  He has a prediabetic, hemoglobin A1c 6.2.  He does have a personal history of kidney stones and underwent right PCNL in 06/2014 for 1.5 cm right renal pelvic stone along with some nonobstructing lower pole stones. Surgery was uncomplicated.  Follow-up KUB was negative for any residual stone burden.    PMH: Past Medical History:  Diagnosis Date  . Allergy   . Anxiety   . Diabetes mellitus without complication (HCC)   . Irritable bowel syndrome   . Kidney disease     Surgical History: Past Surgical History:  Procedure Laterality Date  . kidney stone removal      Home Medications:    Medication List       Accurate as of 02/11/16  8:24 PM. Always use your most recent med list.          metFORMIN 500 MG tablet Commonly known as:  GLUCOPHAGE Take 500 mg by mouth.   prednisoLONE acetate 1 % ophthalmic suspension Commonly known as:  PRED FORTE USE 1 DROP IN RIGHT EYE TWICE DAILY AND IN LEFT EYE ONCE DAILY       Allergies: No Known Allergies  Family History: No family history on file.  Social History:  reports that he has never smoked. He does not have any smokeless tobacco history on file. He reports that he does not drink alcohol. His drug history is not on  file.  ROS: UROLOGY Frequent Urination?: No Hard to postpone urination?: No Burning/pain with urination?: No Get up at night to urinate?: No Leakage of urine?: No Urine stream starts and stops?: No Trouble starting stream?: No Do you have to strain to urinate?: No Blood in urine?: No Urinary tract infection?: No Sexually transmitted disease?: No Injury to kidneys or bladder?: No Painful intercourse?: No Weak stream?: No Erection problems?: No Penile pain?: No  Gastrointestinal Nausea?: No Vomiting?: No Indigestion/heartburn?: No Diarrhea?: No Constipation?: No  Constitutional Fever: No Night sweats?: No Weight loss?: No Fatigue?: No  Skin Skin rash/lesions?: No Itching?: No  Eyes Blurred vision?: No Double vision?: No  Ears/Nose/Throat Sore throat?: No Sinus problems?: No  Hematologic/Lymphatic Swollen glands?: No Easy bruising?: No  Cardiovascular Leg swelling?: No Chest pain?: No  Respiratory Cough?: No Shortness of breath?: No  Endocrine Excessive thirst?: No  Musculoskeletal Back pain?: No Joint pain?: No  Neurological Headaches?: No Dizziness?: No  Psychologic Depression?: No Anxiety?: No  Physical Exam: BP 127/78   Pulse 75   Ht 6' 4.38" (1.94 m)   Wt 177 lb (80.3 kg)   BMI 21.33 kg/m   Constitutional:  Alert and oriented, No acute distress. HEENT: [place] AT, moist mucus membranes.  Trachea midline, no masses. Cardiovascular: No clubbing, cyanosis, or edema. Respiratory: Normal respiratory effort, no increased work of breathing. GI:  Abdomen is soft, nondistended, no abdominal masses.  Mild tenderness in the left lower quadrant with deep palpation. GU: No CVA tenderness.  Skin: No rashes, bruises or suspicious lesions. Neurologic: Grossly intact, no focal deficits, moving all 4 extremities. Psychiatric: Normal mood and affect.  Laboratory Data: Lab Results  Component Value Date   WBC 8.6 06/13/2014   HGB 13.1 06/13/2014    HCT 39.7 (L) 06/13/2014   MCV 94 06/13/2014   PLT 219 06/13/2014    Cr 0.92 on 02/10/16  PSA 1.1  on 02/10/16  Hgb A1c 6.2 on 02/10/16  Urinalysis Results for orders placed or performed in visit on 02/11/16  Microscopic Examination  Result Value Ref Range   WBC, UA 0-5 0 - 5 /hpf   RBC, UA None seen 0 - 2 /hpf   Epithelial Cells (non renal) None seen 0 - 10 /hpf   Casts Present (A) None seen /lpf   Cast Type Hyaline casts N/A   Crystals Present (A) N/A   Crystal Type Amorphous Sediment N/A   Bacteria, UA None seen None seen/Few  Urinalysis, Complete  Result Value Ref Range   Specific Gravity, UA 1.015 1.005 - 1.030   pH, UA 8.5 (H) 5.0 - 7.5   Color, UA Yellow Yellow   Appearance Ur Cloudy (A) Clear   Leukocytes, UA Negative Negative   Protein, UA Negative Negative/Trace   Glucose, UA Negative Negative   Ketones, UA Negative Negative   RBC, UA Negative Negative   Bilirubin, UA Negative Negative   Urobilinogen, Ur 0.2 0.2 - 1.0 mg/dL   Nitrite, UA Negative Negative   Microscopic Examination See below:   BLADDER SCAN AMB NON-IMAGING  Result Value Ref Range   Scan Result 0ml     Pertinent Imaging: PVR as above  Assessment & Plan:  45 year old male with mild LLQ pain, history of nephrolithiasis, urgency frequency x 4-6 weeks, and urinary crystals today without blood concering for possible left distal ureteral stone given his history.  1. Urinary frequency   KUB today to assess for stones, previously radiopaque KUB is negative, I'll try medication such as oxybutynin for bladder overactivity No evidence of incomplete bladder emptying or urinary tract infection today - Urinalysis, Complete - BLADDER SCAN AMB NON-IMAGING - Abdomen 1 view (KUB); Future  2. Urinary urgency As above  3. History of nephrolithiasis Review stone prevention strategies including increased water intake, low salt diet, additional citric acid, and low animal protein   Return for Will call  with KUB results, if negative will try a course of anticholinergics for  symptom control .  Vanna ScotlandAshley Johnjoseph Rolfe, MD  [facility] [address], [address] [place], [place] [address] [phone]

## 2016-02-12 ENCOUNTER — Telehealth: Payer: Self-pay

## 2016-02-12 ENCOUNTER — Ambulatory Visit: Payer: Managed Care, Other (non HMO)

## 2016-02-12 DIAGNOSIS — M542 Cervicalgia: Secondary | ICD-10-CM

## 2016-02-12 NOTE — Telephone Encounter (Signed)
LMOM

## 2016-02-12 NOTE — Therapy (Signed)
[facility] [address], [place], [address] Phone: [phone]   Fax:  [phone]  Physical Therapy Treatment  Patient Details  Name: Nicholas Mitchell MRN: [id number] Date of Birth: 1971/03/01 Referring Provider: Azzie Roup, MD  Encounter Date: 02/12/2016      PT End of Session - 02/12/16 1606    Visit Number 15   Number of Visits 25   Date for PT Re-Evaluation 02/26/16   PT Start Time 1606   PT Stop Time 1646   PT Time Calculation (min) 40 min   Activity Tolerance Patient tolerated treatment well   Behavior During Therapy [facility] for tasks assessed/performed      Past Medical History:  Diagnosis Date  . Allergy   . Anxiety   . Diabetes mellitus without complication ([place])   . Irritable bowel syndrome   . Kidney disease     Past Surgical History:  Procedure Laterality Date  . kidney stone removal      There were no vitals filed for this visit.      Subjective Assessment - 02/12/16 1607    Subjective Neck is better. Just a little bit (L neck), 1-2/10 currently.   Pertinent History Neck pain. Pt works at [employer] and is in a constant position in which he is looking down, working on [employer] for 8-10 hours. Has had neck pain for about 2 years.  Denies bilateral UE paresthesias. Pt is L hand dominant.  Had an x-ray for his neck from his chirpractor which revealed decreased curvature at his neck.  Feels pain in his L neck and L gastroc. Had pain medication which did not help.  Had chiripractic treatment which helped him bend his head to the L.    Patient Stated Goals Patient expresses desire to get better.    Currently in Pain? Yes   Pain Score 2   1-2/10   Pain Onset More than a month ago                                 PT Education - 02/12/16 1609    Education provided Yes   Education Details ther-ex, HEP   Person(s) Educated Patient   Methods  Explanation;Demonstration;Tactile cues;Verbal cues   Comprehension Returned demonstration;Verbalized understanding        Objectives     There-ex  Directed patient with R and L upper trap stretch 10 seconds x 5 each for 2 sets         Seated manually resisted chin tuck targeting L posterior cervical paraspinal muscles 10x3 with 10 seconds   Standing L shoulder press downs (for L scapular depression) resisting red band 10x5 seconds for 3 sets  No L cervical pain   OMEGA rows 20 lbs for 20x  Then 25 lbs for 20x2  Seated manually resisted L scapular retraction targeting the L lower trap muscles 10x 10 seconds for 2 sets   L cervical eccentric side bending with gentle manual resistance to R neck 10x to improve ability to perform L cervical side bending.              Then with self manual resistance 10x 2   Improved exercise technique, movement at target joints, use of target muscles after min to mod verbal, visual, tactile cues.           Improved L posterior cervical paraspinal muscle tone, more even  compared to the R side now. Decreased L neck pain with L scapular depression exercise.          PT Long Term Goals - 01/28/16 1704      PT LONG TERM GOAL #1   Title Patient will have a decrease in neck pain to 5/10 or less at worst to promote ability to perform work related tasks.   Baseline 10/10 neck pain at worst. 4/10 neck pain at most for the past 7 days (01/28/2016)    Time 6   Period Weeks   Status Achieved     PT LONG TERM GOAL #2   Title Patient will be able to perform cervical AROM all planes with minimal to no complain of neck pain to promote ability to move his head.    Time 6   Period Weeks   Status Partially Met     PT LONG TERM GOAL #3   Title Patient will be able to tolerate performing work related duties for at least 2 hours without increase in neck pain to promote ability perform his job more comfortably.    Baseline 3 hour tolerance  (improved) for the standing bench. Difficulty with the sitting bench.    Time 6   Period Weeks   Status Partially Met               Plan - 02/12/16 1609    Clinical Impression Statement Improved L posterior cervical paraspinal muscle tone, more even compared to the R side. Decreased L neck pain with L scapular depression exercise.    Rehab Potential Good   Clinical Impairments Affecting Rehab Potential Chronicity of condition, prolonged position in cervical flexion (8-10 hours) at work   PT Frequency 2x / week   PT Duration 4 weeks   PT Treatment/Interventions Manual techniques;Neuromuscular re-education;Therapeutic exercise;Therapeutic activities;Dry needling;Patient/family education;Ultrasound;Electrical Stimulation   PT Next Visit Plan scapular strengthening, cervical strengthening, manual therapy, posture, ergonomics   Consulted and Agree with Plan of Care Patient      Patient will benefit from skilled therapeutic intervention in order to improve the following deficits and impairments:  Pain, Postural dysfunction  Visit Diagnosis: Cervicalgia     Problem List Patient Active Problem List   Diagnosis Date Noted  . Chronic anterior uveitis, bilateral 11/22/2014  . Sinusitis, chronic 11/22/2014  . 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, [place], [address] Phone: [phone]   Fax:  [phone]  Name: Nicholas Mitchell MRN: [id number] Date of Birth: [birth year]

## 2016-02-12 NOTE — Patient Instructions (Addendum)
  Reviewed and gave standing L shoulder press downs (for scapular depression) resisting a red band as part of his HEP 10x3 with 5 seconds daily. Pt demonstrated and verbalized understanding.

## 2016-02-12 NOTE — Telephone Encounter (Signed)
-----   Message from Vanna ScotlandAshley Brandon, MD sent at 02/11/2016  8:42 PM EST ----- Please let him know that no stones were seen.  Please ask him to come but and pick up samples of vesicare 5 mg daily x 2 weeks to see if it helps.  Please have him call us after he tries the medication to let us know how it worked.    Vanna ScotlandAshley Brandon, MD

## 2016-02-13 NOTE — Telephone Encounter (Signed)
LMOM

## 2016-02-16 NOTE — Telephone Encounter (Signed)
Spoke with pt in reference to kidney stones and vesicare. Pt voiced understanding. Samples were left up front.

## 2016-02-17 ENCOUNTER — Ambulatory Visit: Payer: Managed Care, Other (non HMO)

## 2016-02-19 ENCOUNTER — Ambulatory Visit: Payer: Managed Care, Other (non HMO)

## 2016-02-19 DIAGNOSIS — M542 Cervicalgia: Secondary | ICD-10-CM

## 2016-02-19 NOTE — Therapy (Signed)
[facility] [address], [place], [address] Phone: [phone]   Fax:  [phone]  Physical Therapy Treatment  Patient Details  Name: Nicholas Mitchell MRN: [id number] Date of Birth: Oct 28, 1970 Referring Provider: Azzie Roup, MD  Encounter Date: 02/19/2016      PT End of Session - 02/19/16 1007    Visit Number 16   Number of Visits 25   Date for PT Re-Evaluation 02/26/16   PT Start Time 1007   PT Stop Time 1053   PT Time Calculation (min) 46 min   Activity Tolerance Patient tolerated treatment well   Behavior During Therapy [facility] for tasks assessed/performed      Past Medical History:  Diagnosis Date  . Allergy   . Anxiety   . Diabetes mellitus without complication ([place])   . Irritable bowel syndrome   . Kidney disease     Past Surgical History:  Procedure Laterality Date  . kidney stone removal      There were no vitals filed for this visit.      Subjective Assessment - 02/19/16 1008    Subjective Pt states having bilateral neck pain, R side > L. Pt states that he worked hard all last week with his neck down a lot. Has tried to do his chin tucks more than every hour. Pt was at the standing bench. Did the row exercise which helped a little bit as well as his lower trap raises and UT stretches.  Able to sleep after he does his exercises.   5/10 neck pain currently. 8/10 during work around 3-4 am this morning.  Neck however is better than it was compared to before starting PT.    Pertinent History Neck pain. Pt works at [employer] and is in a constant position in which he is looking down, working on [employer] for 8-10 hours. Has had neck pain for about 2 years.  Denies bilateral UE paresthesias. Pt is L hand dominant.  Had an x-ray for his neck from his chirpractor which revealed decreased curvature at his neck.  Feels pain in his L neck and L gastroc. Had pain medication which did not help.  Had  chiripractic treatment which helped him bend his head to the L.    Patient Stated Goals Patient expresses desire to get better.    Currently in Pain? Yes   Pain Score 5    Pain Onset More than a month ago                                 PT Education - 02/19/16 1052    Education provided Yes   Education Details ther-ex, HEP   Person(s) Educated Patient   Methods Explanation;Demonstration;Tactile cues;Verbal cues;Handout   Comprehension Returned demonstration;Verbalized understanding        Objectives     There-ex  Directed patient with seated manually resisted L scapular retraction 10x10 seconds for 2 sets targeting the lower trap muscle.   Manually resisted R scapular retraction 10x10 seconds for 2 sets targeting the lower trap muscle  Seated thoracic extension over chair 10x5 seconds  Reviewed and given as part of his HEP. Pt demonstrated and verbalized understanding.    Improved exercise technique, movement at target joints, use of target muscles after min to mod verbal, visual, tactile cues.       Manual therapy  Prone UPA to L T1, T3, T4  transverse process grade 3. Stiff. Pt states L neck feels a little better but still tender at the base of his L neck.   Caudal glide L first rib grade 3  STM to L levator scapula/upper trap area. Decreased L neck pain.     Decreased neck pain to 3/10 after session. Per pt, L mid thoracic has bothered him for a while.         PT Long Term Goals - 01/28/16 1704      PT LONG TERM GOAL #1   Title Patient will have a decrease in neck pain to 5/10 or less at worst to promote ability to perform work related tasks.   Baseline 10/10 neck pain at worst. 4/10 neck pain at most for the past 7 days (01/28/2016)    Time 6   Period Weeks   Status Achieved     PT LONG TERM GOAL #2   Title Patient will be able to perform cervical AROM all planes with minimal to no complain of neck pain to promote  ability to move his head.    Time 6   Period Weeks   Status Partially Met     PT LONG TERM GOAL #3   Title Patient will be able to tolerate performing work related duties for at least 2 hours without increase in neck pain to promote ability perform his job more comfortably.    Baseline 3 hour tolerance (improved) for the standing bench. Difficulty with the sitting bench.    Time 6   Period Weeks   Status Partially Met               Plan - 02/19/16 1943    Clinical Impression Statement Decreased neck pain to 3/10 after session. Per pt, L mid thoracic has bothered him for a while.    Rehab Potential Good   Clinical Impairments Affecting Rehab Potential Chronicity of condition, prolonged position in cervical flexion (8-10 hours) at work   PT Frequency 2x / week   PT Duration 4 weeks   PT Treatment/Interventions Manual techniques;Neuromuscular re-education;Therapeutic exercise;Therapeutic activities;Dry needling;Patient/family education;Ultrasound;Electrical Stimulation   PT Next Visit Plan scapular strengthening, cervical strengthening, manual therapy, posture, ergonomics   Consulted and Agree with Plan of Care Patient      Patient will benefit from skilled therapeutic intervention in order to improve the following deficits and impairments:  Pain, Postural dysfunction  Visit Diagnosis: Cervicalgia     Problem List Patient Active Problem List   Diagnosis Date Noted  . Chronic anterior uveitis, bilateral 11/22/2014  . Sinusitis, chronic 11/22/2014  . 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 [place], [address] Phone: [phone]   Fax:  [phone]  Name: Nicholas Mitchell MRN: [id number] Date of Birth: 11/24/1970

## 2016-02-19 NOTE — Patient Instructions (Addendum)
(  Home) Extension: Thoracic With Lumbar Lock - Sitting    Sit with back against chair, knees bent. Extend trunk over chair back. Hold position for __5__ seconds. Repeat ___10  times per set. Do _3___ sets per session daily.   Copyright  VHI. All rights reserved.

## 2016-02-24 ENCOUNTER — Ambulatory Visit: Payer: Managed Care, Other (non HMO)

## 2016-02-24 DIAGNOSIS — M542 Cervicalgia: Secondary | ICD-10-CM | POA: Diagnosis not present

## 2016-02-24 NOTE — Therapy (Signed)
[facility] [address] [place], [address] Phone: [phone]   Fax:  [phone]  Physical Therapy Treatment  Patient Details  Name: Nicholas Mitchell MRN: [id number] Date of Birth: 09-Jul-1970 Referring Provider: Azzie Roup, MD  Encounter Date: 02/24/2016      PT End of Session - 02/24/16 1605    Visit Number 17   Number of Visits 25   Date for PT Re-Evaluation 02/26/16   PT Start Time 2841   PT Stop Time 1656   PT Time Calculation (min) 51 min   Activity Tolerance Patient tolerated treatment well   Behavior During Therapy [facility] for tasks assessed/performed      Past Medical History:  Diagnosis Date  . Allergy   . Anxiety   . Diabetes mellitus without complication ([place])   . Irritable bowel syndrome   . Kidney disease     Past Surgical History:  Procedure Laterality Date  . kidney stone removal      There were no vitals filed for this visit.      Subjective Assessment - 02/24/16 1606    Subjective Has a pain in front of his L neck and numbness L UE to 4th and 5th digits (ulnar nerve area). 4/10 L neck pain currently. Neck felt good after last session.  6/10 neck at most for the past 7 days (Saturday, Sunday, and Monday; working at both the standing and sitting bench; pain coming from standing). Still takes breaks every hour.  Able to tolerate the sitting station and standing station for more than 3 hours without his neck bothering him. Used to have neck pain every hour.    Pertinent History Neck pain. Pt works at [employer] and is in a constant position in which he is looking down, working on [employer] for 8-10 hours. Has had neck pain for about 2 years.  Denies bilateral UE paresthesias. Pt is L hand dominant.  Had an x-ray for his neck from his chirpractor which revealed decreased curvature at his neck.  Feels pain in his L neck and L gastroc. Had pain medication which did not help.  Had chiripractic  treatment which helped him bend his head to the L.    Patient Stated Goals Patient expresses desire to get better.    Currently in Pain? Yes   Pain Score 4    Pain Onset More than a month ago                                 PT Education - 02/24/16 1616    Education provided Yes   Education Details ther-ex   [employer]) Educated Patient   Methods Explanation;Demonstration;Tactile cues;Verbal cues   Comprehension Verbalized understanding;Returned demonstration        Objectives   TTP L anterior scalene, distal portion.     There-ex  Directed patient with seated thoracic extension over chair 10x5 seconds with arms folded. Still has L ulnar nerve symptoms  Standing L scapular depression resisting red band 10x5 seconds for 2 sets  Decreased L ulnar nerve symptoms   L first rib stretch 10x10 seconds with R cervical side bending. Increased L UE symptoms today.   Seated press-ups 10x5 seconds for 2 sets. Cues for muscle use and not stretching the nerves.   L first rib stretch with cervical rotation 10x each side. Neck feels better afterwards.     Improved exercise  technique, movement at target joints, use of target muscles after min to mod verbal, visual, tactile cues.       Manual therapy   Caudal glide L first rib grade 3 STM L scalene. No change in symptoms   Prone UPA to L T2, T3, T4, T5, T6  transverse process grade 3. Stiff.   Decreased neck and L 4th and 5th digit symptoms      Decreased neck and L UE symptoms after joint mobilization to L thoracic spine as well as exercises to help decrease L scalene muscle activation. Improved tolerance to both sitting and standing work stations to 3 hours or more prior to his neck bothering him since initial evaluation based on pt subjective reports.              PT Long Term Goals - 01/28/16 1704      PT LONG TERM GOAL #1   Title Patient will have a decrease in neck pain to  5/10 or less at worst to promote ability to perform work related tasks.   Baseline 10/10 neck pain at worst. 4/10 neck pain at most for the past 7 days (01/28/2016)    Time 6   Period Weeks   Status Achieved     PT LONG TERM GOAL #2   Title Patient will be able to perform cervical AROM all planes with minimal to no complain of neck pain to promote ability to move his head.    Time 6   Period Weeks   Status Partially Met     PT LONG TERM GOAL #3   Title Patient will be able to tolerate performing work related duties for at least 2 hours without increase in neck pain to promote ability perform his job more comfortably.    Baseline 3 hour tolerance (improved) for the standing bench. Difficulty with the sitting bench.    Time 6   Period Weeks   Status Partially Met               Plan - 02/24/16 1616    Clinical Impression Statement Decreased neck and L UE symptoms after joint mobilization to L thoracic spine as well as exercises to help decrease L scalene muscle activation. Improved tolerance to both sitting and standing work stations to 3 hours or more prior to his neck bothering him since initial evaluation based on pt subjective reports.    Rehab Potential Good   Clinical Impairments Affecting Rehab Potential Chronicity of condition, prolonged position in cervical flexion (8-10 hours) at work   PT Frequency 2x / week   PT Duration 4 weeks   PT Treatment/Interventions Manual techniques;Neuromuscular re-education;Therapeutic exercise;Therapeutic activities;Dry needling;Patient/family education;Ultrasound;Electrical Stimulation   PT Next Visit Plan scapular strengthening, cervical strengthening, manual therapy, posture, ergonomics   Consulted and Agree with Plan of Care Patient      Patient will benefit from skilled therapeutic intervention in order to improve the following deficits and impairments:  Pain, Postural dysfunction  Visit Diagnosis: Cervicalgia     Problem  List Patient Active Problem List   Diagnosis Date Noted  . Chronic anterior uveitis, bilateral 11/22/2014  . Sinusitis, chronic 11/22/2014  . 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 [place], [address] Phone: [phone]   Fax:  [phone]  Name: Chanson Teems MRN: [id number] Date of Birth: 18-Feb-1971

## 2016-02-24 NOTE — Patient Instructions (Addendum)
  Held off seated thoracic extension on chair HEP secondary to tension to L ulnar nerve even though it feels better for neck. Pt verbalized understanding.

## 2016-02-26 ENCOUNTER — Ambulatory Visit: Payer: Managed Care, Other (non HMO)

## 2016-02-26 DIAGNOSIS — M542 Cervicalgia: Secondary | ICD-10-CM | POA: Diagnosis not present

## 2016-02-26 NOTE — Patient Instructions (Signed)
Strengthening: Resisted Internal Rotation    Hold green band in left hand, elbow at side and forearm out. Rotate forearm in across body. Hold for 5 seconds Repeat __10__ times per set. Do __3__ sets per session. Do _1___ sessions per day.  http://orth.exer.us/830   Copyright  VHI. All rights reserved.

## 2016-02-26 NOTE — Therapy (Signed)
[facility] [address], [place], [address] Phone: [phone]   Fax:  [phone]  Physical Therapy Treatment  Patient Details  Name: Nicholas Mitchell MRN: [id number] Date of Birth: [birth year] Referring Provider: Pernell Dupre, MD  Encounter Date: 02/26/2016      PT End of Session - 02/26/16 1312    Visit Number 18   Number of Visits 33   Date for PT Re-Evaluation 03/25/16   PT Start Time 9833  pt arrived late   PT Stop Time 1347   PT Time Calculation (min) 35 min   Activity Tolerance Patient tolerated treatment well   Behavior During Therapy [facility] for tasks assessed/performed      Past Medical History:  Diagnosis Date  . Allergy   . Anxiety   . Diabetes mellitus without complication ([place])   . Irritable bowel syndrome   . Kidney disease     Past Surgical History:  Procedure Laterality Date  . kidney stone removal      There were no vitals filed for this visit.      Subjective Assessment - 02/26/16 1314    Subjective Still has a pain in his L neck and L 4th and 5ht digitis. The ulnar nerve L UE is a little better. 6/10 currently.    Pertinent History Neck pain. Pt works at [employer] and is in a constant position in which he is looking down, working on [employer] for 8-10 hours. Has had neck pain for about 2 years.  Denies bilateral UE paresthesias. Pt is L hand dominant.  Had an x-ray for his neck from his chirpractor which revealed decreased curvature at his neck.  Feels pain in his L neck and L gastroc. Had pain medication which did not help.  Had chiripractic treatment which helped him bend his head to the L.    Patient Stated Goals Patient expresses desire to get better.    Currently in Pain? Yes   Pain Score 6    Pain Onset More than a month ago            [facility] PT Assessment - 02/26/16 0001      Assessment   Referring Provider Pernell Dupre, MD                              PT Education - 02/26/16 1943    Education provided Yes   Education Details ther-ex, HEP, plan of care   Person(s) Educated Patient   Methods Explanation;Demonstration;Tactile cues;Verbal cues   Comprehension Verbalized understanding;Returned demonstration        Objectives    There-ex  Directed patient with seated manually resisted R cervical side bend 10x3 with 5 second holds. Decreased L neck pain  Seated manually resisted chin tucks targeting L posterior cervical muscles. 10x 10 seconds  Seated manually resisted L scapular retraction 10x10 seconds targeting lower trap muscle  L ulnar nerve symptom reproduction with L shoulder ER with manual resistance  Standing L shoulder IR resisting green band 10x5 seconds for 2 sets. Decreased symptoms  Reviewed and given as part of his HEP. Pt demonstrated and verbalized understanding.    Reviewed plan of care: 2x/week for 4 weeks  Improved exercise technique, movement at target joints, use of target muscles after min to mod verbal, visual, tactile cues.      Manual therapy   L cervical rotation 10x3 with R UPA to  C3 transverse process  L cervical rotation 10x with L to R transverse glide to C7 spinous process   No change in symptoms   TTP L teres minor   muscle area with reproduction of L Ulnar nerve symptoms.   STM to L teres minor muscle area. Increased symptoms which eases with rest. Increased L hand symptoms afterwards        Decreased symptoms with exercse to decrease R shoulder ER muscle activation.    Pt demonstrates overall decreased neck pain at worst since initial evaluation as well as improved tolerance working on both his sitting and standing stations at work, being able to tolerate each station for at least 3 hours without increased neck pain. Pt still however has difficulty with neck and L UE symptoms, especially after work, difficulty with neck ROM  due to pain and would benefit from continued skilled physical therapy services to address the aforementioned deficits.         PT Long Term Goals - 02/26/16 1552      PT LONG TERM GOAL #1   Title Patient will have a decrease in neck pain to 5/10 or less at worst to promote ability to perform work related tasks.   Baseline 10/10 neck pain at worst. 4/10 neck pain at most for the past 7 days (01/28/2016); at least 6/10 at most for the past 7 days (02/26/2016)   Time 6   Period Weeks   Status On-going     PT LONG TERM GOAL #2   Title Patient will be able to perform cervical AROM all planes with minimal to no complain of neck pain to promote ability to move his head.    Time 6   Period Weeks   Status Partially Met     PT LONG TERM GOAL #3   Title Patient will be able to tolerate performing work related duties for at least 2 hours without increase in neck pain to promote ability perform his job more comfortably.    Baseline 3 hour tolerance (improved) for the standing bench. Difficulty with the sitting bench;             Able to tolerate at least 3 hours without increased neck pain for both sitting and standing work stations 02/24/2016   Time 6   Period Weeks   Status Achieved               Plan - 02/26/16 1943    Clinical Impression Statement Pt demonstrates overall decreased neck pain at worst since initial evaluation as well as improved tolerance working on both his sitting and standing stations at work, being able to tolerate each station for at least 3 hours without increased neck pain. Pt still however has difficulty with neck and L UE symptoms, especially after work, difficulty with neck ROM due to pain and would benefit from continued skilled physical therapy services to address the aforementioned deficits.    Rehab Potential Good   Clinical Impairments Affecting Rehab Potential Chronicity of condition, prolonged position in cervical flexion (8-10 hours) at work   PT  Frequency 2x / week   PT Duration 4 weeks   PT Treatment/Interventions Manual techniques;Neuromuscular re-education;Therapeutic exercise;Therapeutic activities;Dry needling;Patient/family education;Ultrasound;Electrical Stimulation   PT Next Visit Plan scapular strengthening, cervical strengthening, manual therapy, posture, ergonomics   Consulted and Agree with Plan of Care Patient      Patient will benefit from skilled therapeutic intervention in order to improve the following deficits and impairments:  Pain,  Postural dysfunction  Visit Diagnosis: Cervicalgia - Plan: PT plan of care cert/re-cert     Problem List Patient Active Problem List   Diagnosis Date Noted  . Chronic anterior uveitis, bilateral 11/22/2014  . Sinusitis, chronic 11/22/2014  . 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, [place], [address] Phone: [phone]   Fax:  [phone]  Name: Taison Celani MRN: [id number] Date of Birth: June 02, 1970

## 2016-03-03 ENCOUNTER — Ambulatory Visit: Payer: Managed Care, Other (non HMO)

## 2016-03-03 DIAGNOSIS — M542 Cervicalgia: Secondary | ICD-10-CM | POA: Diagnosis not present

## 2016-03-03 NOTE — Therapy (Signed)
[facility] [address], [place], [address] Phone: [phone]   Fax:  [phone]  Physical Therapy Treatment  Patient Details  Name: Nicholas Mitchell MRN: [id number] Date of Birth: Feb 01, 1971 Referring Provider: Pernell Dupre, MD  Encounter Date: 03/03/2016      PT End of Session - 03/03/16 1650    Visit Number 19   Number of Visits 33   Date for PT Re-Evaluation 03/25/16   PT Start Time 7591   PT Stop Time 1739   PT Time Calculation (min) 49 min   Activity Tolerance Patient tolerated treatment well   Behavior During Therapy [facility] for tasks assessed/performed      Past Medical History:  Diagnosis Date  . Allergy   . Anxiety   . Diabetes mellitus without complication ([place])   . Irritable bowel syndrome   . Kidney disease     Past Surgical History:  Procedure Laterality Date  . kidney stone removal      There were no vitals filed for this visit.      Subjective Assessment - 03/03/16 1651    Subjective Neck is 5-6/10 this morning (after light work), 4/10 at the moment. L neck pain. Neck felt better after last week's session.     Pertinent History Neck pain. Pt works at [employer] and is in a constant position in which he is looking down, working on [employer] for 8-10 hours. Has had neck pain for about 2 years.  Denies bilateral UE paresthesias. Pt is L hand dominant.  Had an x-ray for his neck from his chirpractor which revealed decreased curvature at his neck.  Feels pain in his L neck and L gastroc. Had pain medication which did not help.  Had chiripractic treatment which helped him bend his head to the L.    Patient Stated Goals Patient expresses desire to get better.    Currently in Pain? Yes   Pain Score 4    Pain Onset More than a month ago                                 PT Education - 03/03/16 1712    Education provided Yes   Education Details ther-ex   [employer])  Educated Patient   Methods Explanation;Demonstration;Tactile cues;Verbal cues   Comprehension Returned demonstration;Verbalized understanding        Objectives  Manual therapy  Supine UPA to R C2 transverse process grade 3 Suboccipital release  Supine L cervical rotation with R UPA to C4 TP  Decreased L lateral neck pain at rest afterwards.       There-ex  Directed patient with supine cervical nodding 10x3   Supine cervical rotation with gentle chin tuck 10x3 each direction   Seated press-ups 10x3 to help decrease scalene muscle activation.   Decreased L neck pain to 1/10   TRX strap rows 10x3 with eccentric return. Decreased symptoms.  L shoulder IR resisting plate 5 at Omega machine 10x, then 8x. L arm and hand symptoms.  Omega rows plate 20 for 63W4 seconds for 2 sets  TRX strap rows with eccentric return again 10x2. Minimal to no neck pain, and no L UE symptoms.     Improved exercise technique, movement at target joints, use of target muscles after min to mod verbal, visual, tactile cues.       Significant decrease in neck and L UE symptoms  after performing exercises to help strengthen scapular muscles and decrease L scalene muscle activation. TRX rows had the most beneficial effect per pt reports.           PT Long Term Goals - 02/26/16 1552      PT LONG TERM GOAL #1   Title Patient will have a decrease in neck pain to 5/10 or less at worst to promote ability to perform work related tasks.   Baseline 10/10 neck pain at worst. 4/10 neck pain at most for the past 7 days (01/28/2016); at least 6/10 at most for the past 7 days (02/26/2016)   Time 6   Period Weeks   Status On-going     PT LONG TERM GOAL #2   Title Patient will be able to perform cervical AROM all planes with minimal to no complain of neck pain to promote ability to move his head.    Time 6   Period Weeks   Status Partially Met     PT LONG TERM GOAL #3   Title Patient will be  able to tolerate performing work related duties for at least 2 hours without increase in neck pain to promote ability perform his job more comfortably.    Baseline 3 hour tolerance (improved) for the standing bench. Difficulty with the sitting bench;             Able to tolerate at least 3 hours without increased neck pain for both sitting and standing work stations 02/24/2016   Time 6   Period Weeks   Status Achieved               Plan - 03/03/16 1712    Clinical Impression Statement Significant decrease in neck and L UE symptoms after performing exercises to help strengthen scapular muscles and decrease L scalene muscle activation. TRX rows had the most beneficial effect per pt reports.    Rehab Potential Good   Clinical Impairments Affecting Rehab Potential Chronicity of condition, prolonged position in cervical flexion (8-10 hours) at work   PT Frequency 2x / week   PT Duration 4 weeks   PT Treatment/Interventions Manual techniques;Neuromuscular re-education;Therapeutic exercise;Therapeutic activities;Dry needling;Patient/family education;Ultrasound;Electrical Stimulation   PT Next Visit Plan scapular strengthening, cervical strengthening, manual therapy, posture, ergonomics   Consulted and Agree with Plan of Care Patient      Patient will benefit from skilled therapeutic intervention in order to improve the following deficits and impairments:  Pain, Postural dysfunction  Visit Diagnosis: Cervicalgia     Problem List Patient Active Problem List   Diagnosis Date Noted  . Chronic anterior uveitis, bilateral 11/22/2014  . Sinusitis, chronic 11/22/2014  . 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, [place], [address] Phone: [phone]   Fax:  [phone]  Name: Zyrus Hetland MRN: [id number] Date of Birth:  1971/02/20

## 2016-03-09 ENCOUNTER — Ambulatory Visit: Payer: Managed Care, Other (non HMO) | Attending: Internal Medicine

## 2016-03-09 DIAGNOSIS — M542 Cervicalgia: Secondary | ICD-10-CM | POA: Insufficient documentation

## 2016-03-09 NOTE — Therapy (Signed)
[facility] [address], [place], [address] Phone: [phone]   Fax:  [phone]  Physical Therapy Treatment  Patient Details  Name: Nicholas Mitchell MRN: [id number] Date of Birth: 05/20/1970 Referring Provider: Pernell Dupre, MD  Encounter Date: 03/09/2016      PT End of Session - 03/09/16 1703    Visit Number 20   Number of Visits 33   Date for PT Re-Evaluation 03/25/16   PT Start Time 1700   PT Stop Time 1725   PT Time Calculation (min) 25 min   Activity Tolerance Patient tolerated treatment well   Behavior During Therapy [facility] for tasks assessed/performed      Past Medical History:  Diagnosis Date  . Allergy   . Anxiety   . Diabetes mellitus without complication ([place])   . Irritable bowel syndrome   . Kidney disease     Past Surgical History:  Procedure Laterality Date  . kidney stone removal      There were no vitals filed for this visit.      Subjective Assessment - 03/09/16 1700    Subjective Pt arrived approximately 15 minutes late for his appointment so session was shortened accordingly due to scheduling limitation. Pt reports that currently his neck pain is 6/10. He reports that he has some good days and some bad days. Unable to answer question regarding whether he has improved since starting therapy.    Pertinent History Neck pain. Pt works at [employer] and is in a constant position in which he is looking down, working on [employer] for 8-10 hours. Has had neck pain for about 2 years.  Denies bilateral UE paresthesias. Pt is L hand dominant.  Had an x-ray for his neck from his chirpractor which revealed decreased curvature at his neck.  Feels pain in his L neck and L gastroc. Had pain medication which did not help.  Had chiripractic treatment which helped him bend his head to the L.    Patient Stated Goals Patient expresses desire to get better.    Currently in Pain? Yes   Pain Score 6    Pain Location Neck   Pain Orientation Left   Pain Type Chronic pain   Pain Onset More than a month ago           TREATMENT  Manual therapy Suboccipital release x 3 minutes; Cervical manual distraction 15 second hold, 5 second rest x 5; L upper trap stretch 30s x 2; L scalene stretch 30s x 2; L first rib mobilizations grade III, 30s/bout x 3 bouts;    Ther-ex Repeated cervical retraction 2 x 10; TRX low and mid rows 2 x 10 each with eccentric return. Decreased symptoms reported following this exercise;                     PT Education - 03/09/16 1702    Education provided Yes   Education Details HEP reinforced   Person(s) Educated Patient   Methods Explanation   Comprehension Verbalized understanding             PT Long Term Goals - 02/26/16 1552      PT LONG TERM GOAL #1   Title Patient will have a decrease in neck pain to 5/10 or less at worst to promote ability to perform work related tasks.   Baseline 10/10 neck pain at worst. 4/10 neck pain at most for the past 7 days (01/28/2016); at least  6/10 at most for the past 7 days (02/26/2016)   Time 6   Period Weeks   Status On-going     PT LONG TERM GOAL #2   Title Patient will be able to perform cervical AROM all planes with minimal to no complain of neck pain to promote ability to move his head.    Time 6   Period Weeks   Status Partially Met     PT LONG TERM GOAL #3   Title Patient will be able to tolerate performing work related duties for at least 2 hours without increase in neck pain to promote ability perform his job more comfortably.    Baseline 3 hour tolerance (improved) for the standing bench. Difficulty with the sitting bench;             Able to tolerate at least 3 hours without increased neck pain for both sitting and standing work stations 02/24/2016   Time 6   Period Weeks   Status Achieved               Plan - 03/09/16 1703    Clinical Impression Statement Pt  reports complete resolution of his neck pain following session today. He reports the most improvement in his pain with L first rib mobilizations. Pt encouraged to continue HEP and follow-up as scheduled.    Rehab Potential Good   Clinical Impairments Affecting Rehab Potential Chronicity of condition, prolonged position in cervical flexion (8-10 hours) at work   PT Frequency 2x / week   PT Duration 4 weeks   PT Treatment/Interventions Manual techniques;Neuromuscular re-education;Therapeutic exercise;Therapeutic activities;Dry needling;Patient/family education;Ultrasound;Electrical Stimulation   PT Next Visit Plan scapular strengthening, cervical strengthening, manual therapy, posture, ergonomics   PT Home Exercise Plan As prescribed   Consulted and Agree with Plan of Care Patient      Patient will benefit from skilled therapeutic intervention in order to improve the following deficits and impairments:  Pain, Postural dysfunction  Visit Diagnosis: Cervicalgia     Problem List Patient Active Problem List   Diagnosis Date Noted  . Chronic anterior uveitis, bilateral 11/22/2014  . Sinusitis, chronic 11/22/2014  . 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, [place], [address] Phone: [phone]   Fax:  [phone]  Name: Nicholas Mitchell MRN: [id number] Date of Birth: 23-Sep-1970

## 2016-03-11 ENCOUNTER — Ambulatory Visit: Payer: Managed Care, Other (non HMO)

## 2016-03-11 DIAGNOSIS — M542 Cervicalgia: Secondary | ICD-10-CM

## 2016-03-11 NOTE — Therapy (Signed)
[facility] [address], [place], [address] Phone: [phone]   Fax:  [phone]  Physical Therapy Treatment  Patient Details  Name: Nicholas Mitchell MRN: [id number] Date of Birth: 11/15/1970 Referring Provider: Pernell Dupre, MD  Encounter Date: 03/11/2016      PT End of Session - 03/11/16 1307    Visit Number 21   Number of Visits 33   Date for PT Re-Evaluation 03/25/16   PT Start Time 1300   PT Stop Time 1345   PT Time Calculation (min) 45 min   Activity Tolerance Patient tolerated treatment well   Behavior During Therapy [facility] for tasks assessed/performed      Past Medical History:  Diagnosis Date  . Allergy   . Anxiety   . Diabetes mellitus without complication ([place])   . Irritable bowel syndrome   . Kidney disease     Past Surgical History:  Procedure Laterality Date  . kidney stone removal      There were no vitals filed for this visit.      Subjective Assessment - 03/11/16 1300    Subjective Pt reports that he felt significant improvement in his neck and LUE pain following his session on Tuesday and then into Wednesday. HEP is going well without issue. No specific questions or concerns at this time.    Pertinent History Neck pain. Pt works at [employer] and is in a constant position in which he is looking down, working on [employer] for 8-10 hours. Has had neck pain for about 2 years.  Denies bilateral UE paresthesias. Pt is L hand dominant.  Had an x-ray for his neck from his chirpractor which revealed decreased curvature at his neck.  Feels pain in his L neck and L gastroc. Had pain medication which did not help.  Had chiripractic treatment which helped him bend his head to the L.    Patient Stated Goals Patient expresses desire to get better.    Currently in Pain? Yes   Pain Score 3    Pain Location Neck   Pain Orientation Left   Pain Descriptors / Indicators Spasm   Pain Type Chronic pain    Pain Radiating Towards LUE   Pain Onset More than a month ago   Pain Frequency Constant         TREATMENT  Manual therapy Suboccipital release x 3 minutes; Cervical manual distraction 15 second hold, 5 second rest x 5; L upper trap stretch 30s x 2; L scalene stretch 30s x 2; Left and right first rib mobilizations grade III, 30s/bout x 3 bouts (right side performed at patient's request); L masseter and temporalis STM L upper trap STM with increased focus to anterior fibers;   Ther-ex Repeated cervical retraction 3 x 5; TRX low and mid rows 2 x 10 each with eccentric return. Decreased symptoms reported following this exercise;  Pt provided information regarding bodyweight suspension straps that he can purchase online or in the stores;                         PT Education - 03/11/16 1307    Education provided Yes   Education Details HEP and plan of care   Person(s) Educated Patient   Methods Explanation   Comprehension Verbalized understanding             PT Long Term Goals - 02/26/16 1552      PT LONG TERM  GOAL #1   Title Patient will have a decrease in neck pain to 5/10 or less at worst to promote ability to perform work related tasks.   Baseline 10/10 neck pain at worst. 4/10 neck pain at most for the past 7 days (01/28/2016); at least 6/10 at most for the past 7 days (02/26/2016)   Time 6   Period Weeks   Status On-going     PT LONG TERM GOAL #2   Title Patient will be able to perform cervical AROM all planes with minimal to no complain of neck pain to promote ability to move his head.    Time 6   Period Weeks   Status Partially Met     PT LONG TERM GOAL #3   Title Patient will be able to tolerate performing work related duties for at least 2 hours without increase in neck pain to promote ability perform his job more comfortably.    Baseline 3 hour tolerance (improved) for the standing bench. Difficulty with the sitting bench;              Able to tolerate at least 3 hours without increased neck pain for both sitting and standing work stations 02/24/2016   Time 6   Period Weeks   Status Achieved               Plan - 03/11/16 1606    Clinical Impression Statement Pt reports complete resolution of his neck pain following session today but continues to reports some pain in bilateral UEs. He appears to benefit most significantly from first rib mobilizations. Pt reports that the manual work on his neck has been the most helpful thing for him with respect to his pain thus far. He would benefit from continued work on anterior scalenes and pec minor as well as manual traction and STM to upper traps. Pt would like to spend some additional time next session on the R side of his neck as this has been bothering him lately as well. Pt encouraged to continue HEP and follow-up with primary therapist.    Rehab Potential Good   Clinical Impairments Affecting Rehab Potential Chronicity of condition, prolonged position in cervical flexion (8-10 hours) at work   PT Frequency 2x / week   PT Duration 4 weeks   PT Treatment/Interventions Manual techniques;Neuromuscular re-education;Therapeutic exercise;Therapeutic activities;Dry needling;Patient/family education;Ultrasound;Electrical Stimulation   PT Next Visit Plan First rib mobilizations, scalene stretching, STM to upper trap, manual traction, scapular strengthening, cervical strengthening, posture, ergonomics   PT Home Exercise Plan As prescribed   Consulted and Agree with Plan of Care Patient      Patient will benefit from skilled therapeutic intervention in order to improve the following deficits and impairments:  Pain, Postural dysfunction  Visit Diagnosis: Cervicalgia     Problem List Patient Active Problem List   Diagnosis Date Noted  . Chronic anterior uveitis, bilateral 11/22/2014  . Sinusitis, chronic 11/22/2014  . 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 [place], [address] Phone: [phone]   Fax:  [phone]  Name: Nicholas Mitchell MRN: [id number] Date of Birth: Jul 12, 1970

## 2016-03-15 ENCOUNTER — Ambulatory Visit: Payer: Managed Care, Other (non HMO)

## 2016-03-15 DIAGNOSIS — M542 Cervicalgia: Secondary | ICD-10-CM

## 2016-03-15 NOTE — Therapy (Signed)
[facility] [address], [place], [address] Phone: [phone]   Fax:  [phone]  Physical Therapy Treatment  Patient Details  Name: Nicholas Mitchell MRN: [id number] Date of Birth: Jul 24, 1970 Referring Provider: Pernell Dupre, MD  Encounter Date: 03/15/2016      PT End of Session - 03/15/16 1353    Visit Number 22   Number of Visits 33   Date for PT Re-Evaluation 03/25/16   PT Start Time 1353  pt arrived late   PT Stop Time 1445   PT Time Calculation (min) 52 min   Activity Tolerance Patient tolerated treatment well   Behavior During Therapy [facility] for tasks assessed/performed      Past Medical History:  Diagnosis Date  . Allergy   . Anxiety   . Diabetes mellitus without complication ([place])   . Irritable bowel syndrome   . Kidney disease     Past Surgical History:  Procedure Laterality Date  . kidney stone removal      There were no vitals filed for this visit.      Subjective Assessment - 03/15/16 1354    Subjective Neck is not bad. Good. 3/10 L neck, 4-5/10 R neck pain currently. 4/10 neck pain at most for the past 7 days.    Pertinent History Neck pain. Pt works at [employer] and is in a constant position in which he is looking down, working on [employer] for 8-10 hours. Has had neck pain for about 2 years.  Denies bilateral UE paresthesias. Pt is L hand dominant.  Had an x-ray for his neck from his chirpractor which revealed decreased curvature at his neck.  Feels pain in his L neck and L gastroc. Had pain medication which did not help.  Had chiripractic treatment which helped him bend his head to the L.    Patient Stated Goals Patient expresses desire to get better.    Currently in Pain? Yes   Pain Score 5   4-5/10 R neck pain   Pain Onset More than a month ago                                 PT Education - 03/15/16 1414    Education provided Yes   Education Details  ther-ex   [employer]) Educated Patient   Methods Explanation;Demonstration;Tactile cues;Verbal cues   Comprehension Returned demonstration;Verbalized understanding        Objectives  Manual therapy   Caudal glide R first rib grade 3 STM R upper trap/scalene muscle area   Decreased R neck pain  Caudal glide L first rib grade 3 STM L upper trap/scalene muscle area    Decreased L neck pain  Supine R UPA to R C2 transverse process grade 3 Supine suboccipital release x 1 min Supine gentle manual cervical traction  Neck feels better afterwards per pt.     There-ex   Eccentric rows using TRX strap 10x. Pt states that his strap should arrive January 11  TRX bicep curls 10x2 medium level effort  TRX mini standing push-ups, medium effort 10x2   Standing bilateral shoulder extension at [employer] machine plate 15 for 10G2 seconds   L shoulder IR resisting green band 10x5 seconds   Then with blue band 10x5 seconds for 2 sets   upper trap stretch 30 seconds x 2 each side     Improved exercise technique, movement at target  joints, use of target muscles after min to mod verbal, visual, tactile cues.     Decreased neck pain and bilateral UE symptoms with manual therapy to decrease scalene, upper trap muscle tension and with scapular strengthening exercises using the TRX strap. 1/10 R neck and 2/10 L neck pain after session.         PT Long Term Goals - 02/26/16 1552      PT LONG TERM GOAL #1   Title Patient will have a decrease in neck pain to 5/10 or less at worst to promote ability to perform work related tasks.   Baseline 10/10 neck pain at worst. 4/10 neck pain at most for the past 7 days (01/28/2016); at least 6/10 at most for the past 7 days (02/26/2016)   Time 6   Period Weeks   Status On-going     PT LONG TERM GOAL #2   Title Patient will be able to perform cervical AROM all planes with minimal to no complain of neck pain to promote ability to move his head.     Time 6   Period Weeks   Status Partially Met     PT LONG TERM GOAL #3   Title Patient will be able to tolerate performing work related duties for at least 2 hours without increase in neck pain to promote ability perform his job more comfortably.    Baseline 3 hour tolerance (improved) for the standing bench. Difficulty with the sitting bench;             Able to tolerate at least 3 hours without increased neck pain for both sitting and standing work stations 02/24/2016   Time 6   Period Weeks   Status Achieved               Plan - 03/15/16 1415    Clinical Impression Statement Decreased neck pain and bilateral UE symptoms with manual therapy to decrease scalene, upper trap muscle tension and with scapular strengthening exercises using the TRX strap. 1/10 R neck and 2/10 L neck pain after session.    Rehab Potential Good   Clinical Impairments Affecting Rehab Potential Chronicity of condition, prolonged position in cervical flexion (8-10 hours) at work   PT Frequency 2x / week   PT Duration 4 weeks   PT Treatment/Interventions Manual techniques;Neuromuscular re-education;Therapeutic exercise;Therapeutic activities;Dry needling;Patient/family education;Ultrasound;Electrical Stimulation   PT Next Visit Plan First rib mobilizations, scalene stretching, STM to upper trap, manual traction, scapular strengthening, cervical strengthening, posture, ergonomics   PT Home Exercise Plan As prescribed   Consulted and Agree with Plan of Care Patient      Patient will benefit from skilled therapeutic intervention in order to improve the following deficits and impairments:  Pain, Postural dysfunction  Visit Diagnosis: Cervicalgia     Problem List Patient Active Problem List   Diagnosis Date Noted  . Chronic anterior uveitis, bilateral 11/22/2014  . Sinusitis, chronic 11/22/2014  . 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, [place], [address] Phone: [phone]   Fax:  [phone]  Name: Quang Thorpe MRN: [id number] Date of Birth: 03-19-1970

## 2016-03-17 ENCOUNTER — Ambulatory Visit: Payer: Managed Care, Other (non HMO)

## 2016-03-23 ENCOUNTER — Ambulatory Visit: Payer: Managed Care, Other (non HMO)

## 2016-03-23 ENCOUNTER — Telehealth: Payer: Self-pay

## 2016-03-23 NOTE — Telephone Encounter (Signed)
No show. Called and left a message pertaining to today's appointment and a reminder for his next follow up session. Return phone call requested. Phone number provided ([phone]) 

## 2016-03-25 ENCOUNTER — Ambulatory Visit: Payer: Managed Care, Other (non HMO)

## 2016-04-20 ENCOUNTER — Telehealth: Payer: Self-pay | Admitting: Urology

## 2016-04-20 MED ORDER — SOLIFENACIN SUCCINATE 10 MG PO TABS: 10.0000 mg | ORAL_TABLET | Freq: Every day | ORAL | 5 refills | Status: DC

## 2016-04-20 NOTE — Telephone Encounter (Signed)
Patient said that the vesicare worked and he would like a script for this called into his pharmacy.  [facility] [address]   Thanks,  CollyerMichelle

## 2016-04-20 NOTE — Telephone Encounter (Signed)
Script sent to pharmacy.  I would like to see in in ~6 month to reassess.    Nicholas ScotlandAshley Zniyah Midkiff, MD

## 2016-04-21 NOTE — Telephone Encounter (Signed)
Called the patient and left a message to call back. Made a six month follow up app  michelle

## 2016-05-18 IMAGING — RF DG CHEST 1V
1 series · 1 of 1 positions shown · non-contrast
Comparison: none

[Series 1: (hospital) · 1 of 1 slices shown]
[im 1/1]
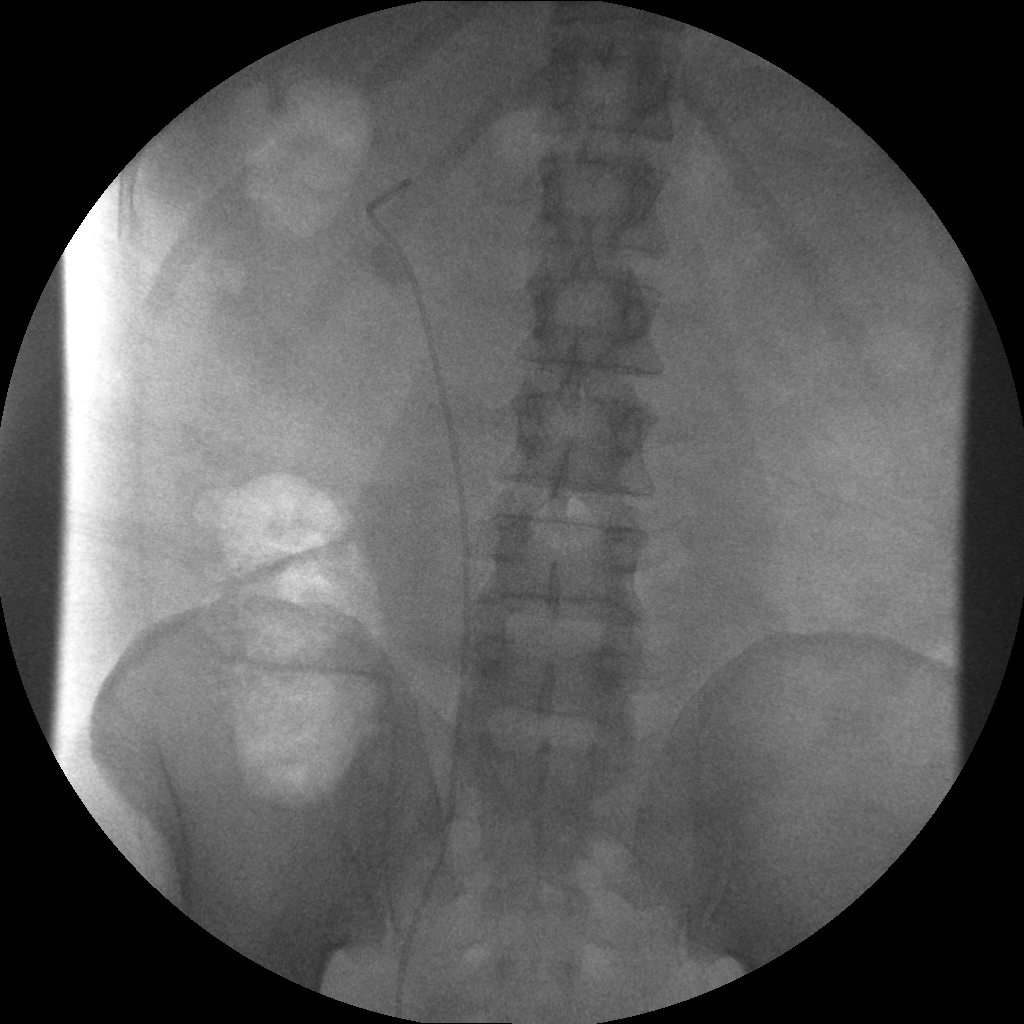

[1 of 1 positions shown; findings below may reference images not displayed]

Canned report from images found in remote index.

Refer to host system for actual result text.

## 2016-05-18 IMAGING — CT CT ABD-PELV W/O CM
2 of 4 series · 17 of 46 positions shown, 19 images · non-contrast
Comparison: Ultrasound from earlier the same day

CLINICAL DATA: right side flank pain started this am. hx of kidney
stones.

EXAM:
CT ABDOMEN AND PELVIS WITHOUT CONTRAST
TECHNIQUE: Multidetector CT imaging of the abdomen and pelvis was performed
following the standard protocol without IV contrast.

[Series 2: stone standard full · axial · 0.74mm/px · z∈[-520,-64]mm · 14 of 101 slices shown, 16 images]
[im 5/101  soft-tissue]
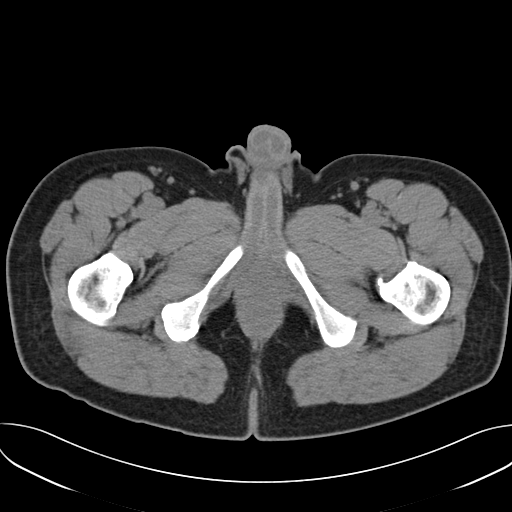
[im 5/101  bone]
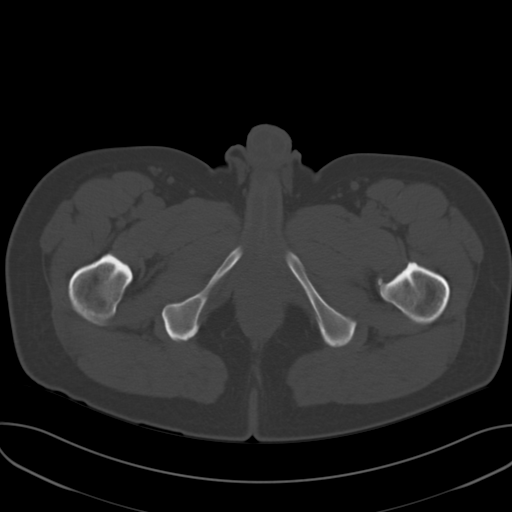
[im 13/101  soft-tissue]
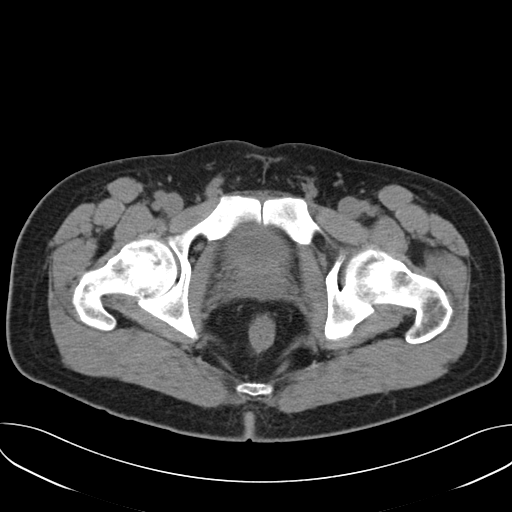
[im 21/101  soft-tissue]
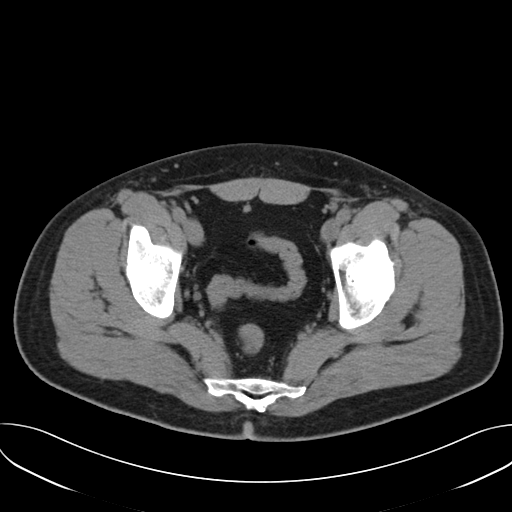
[im 26/101  soft-tissue]
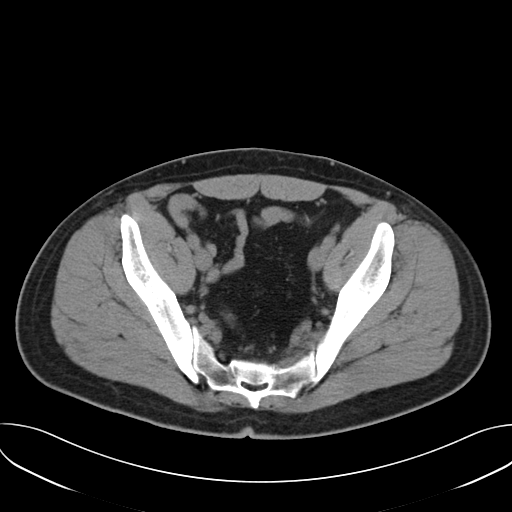
[im 34/101  soft-tissue]
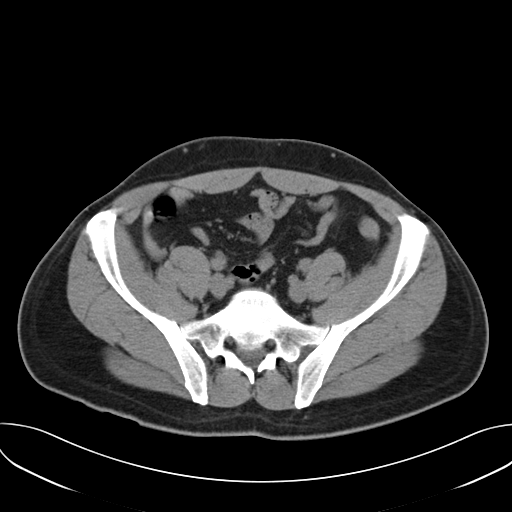
[im 42/101  soft-tissue]
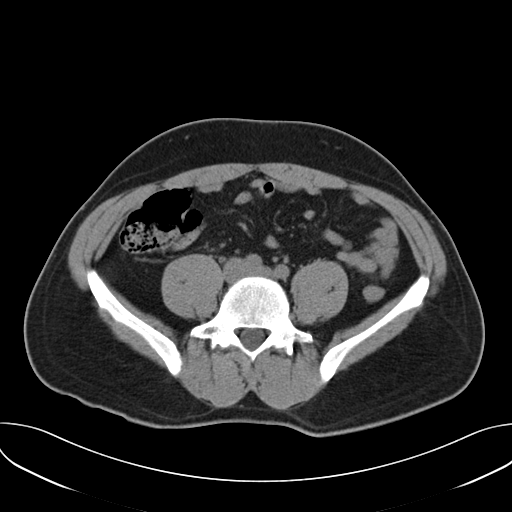
[im 46/101  soft-tissue]
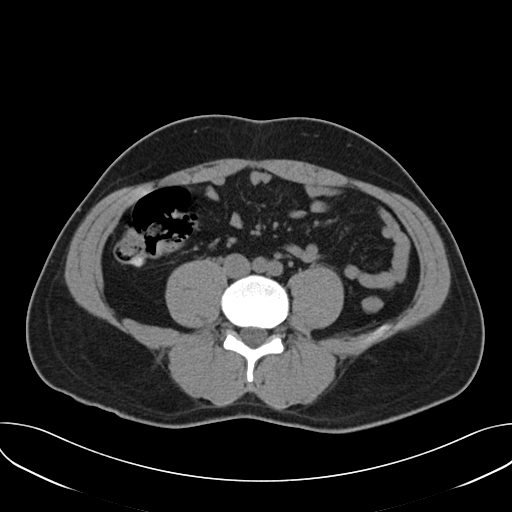
[im 55/101  soft-tissue]
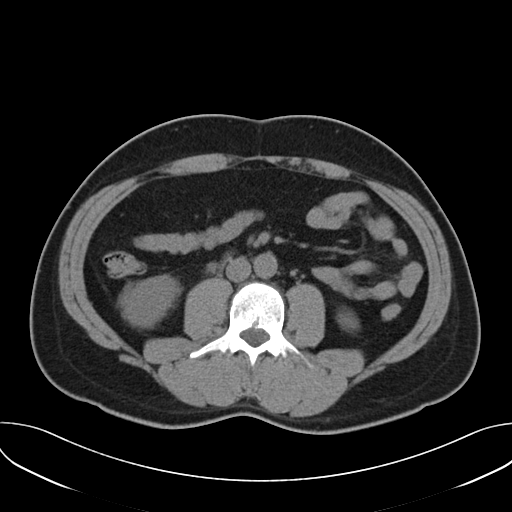
[im 59/101  soft-tissue]
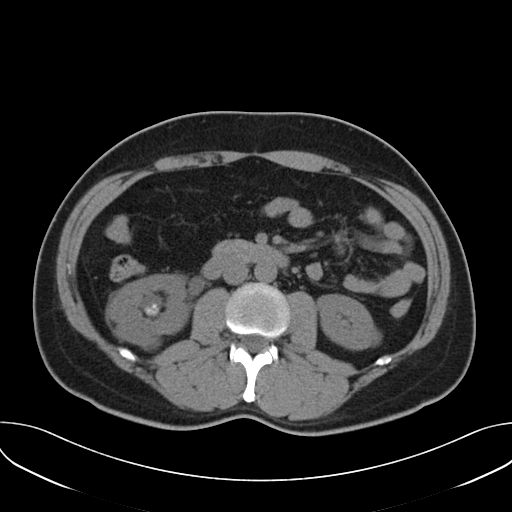
[im 59/101  bone]
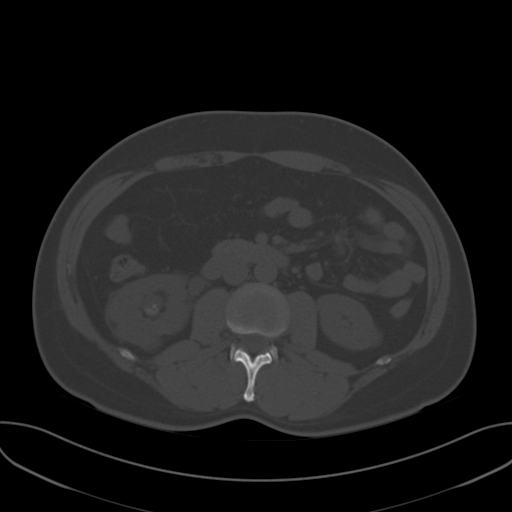
[im 67/101  soft-tissue]
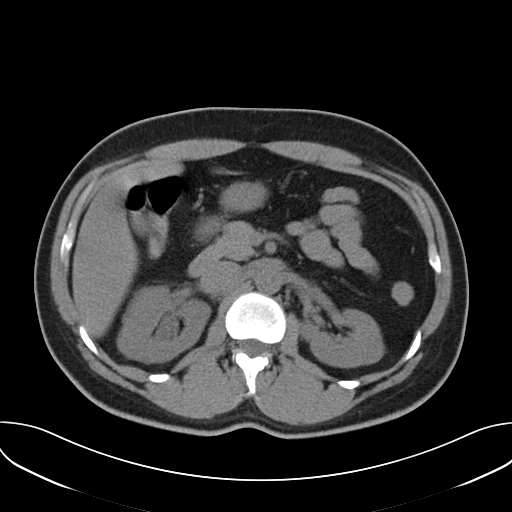
[im 76/101  soft-tissue]
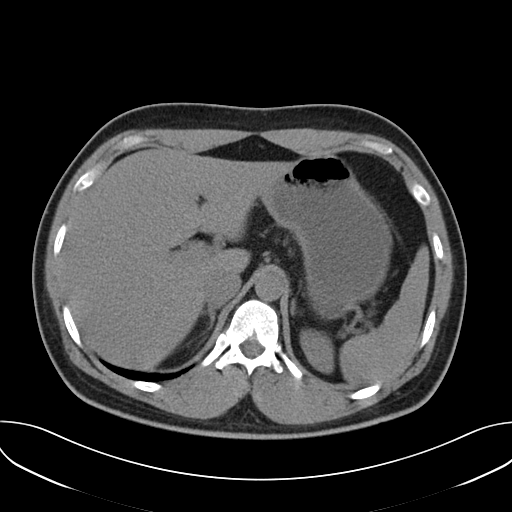
[im 80/101  soft-tissue]
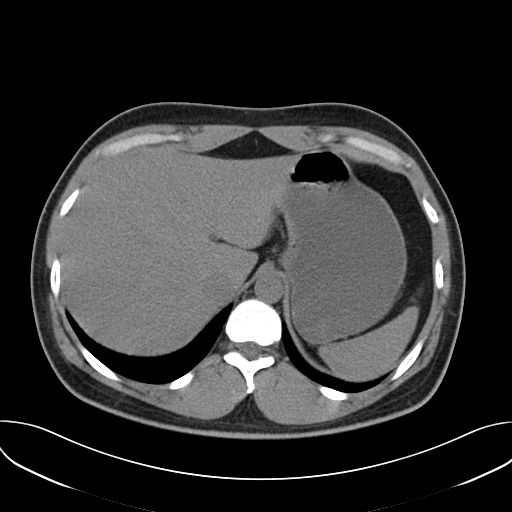
[im 88/101  soft-tissue]
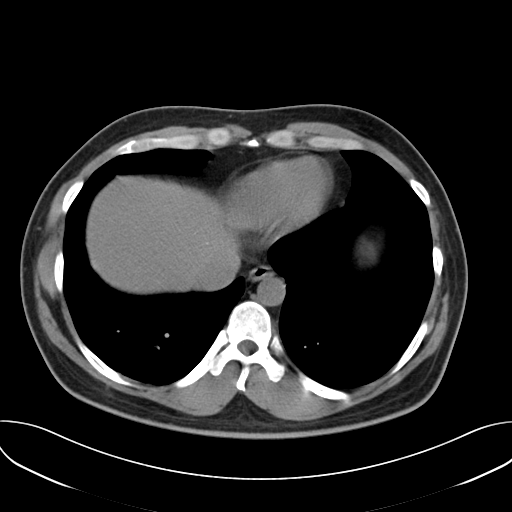
[im 96/101  soft-tissue]
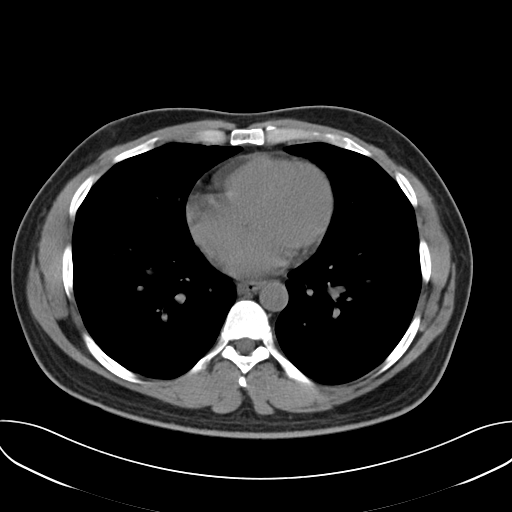

[Series 5: cor stone standard full · coronal · 0.92mm/px · 3 of 119 slices shown]
[im 40/119  soft-tissue]
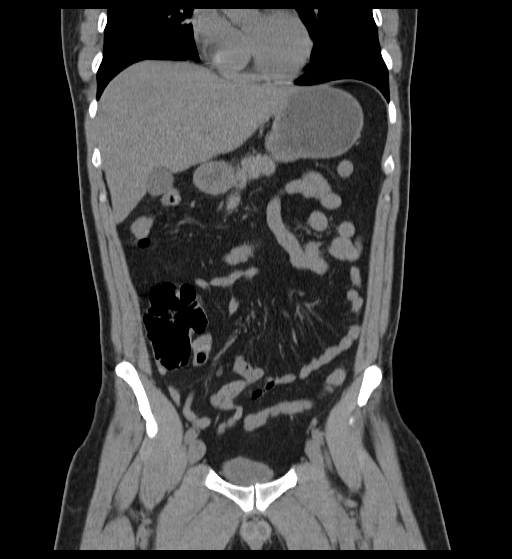
[im 53/119  soft-tissue]
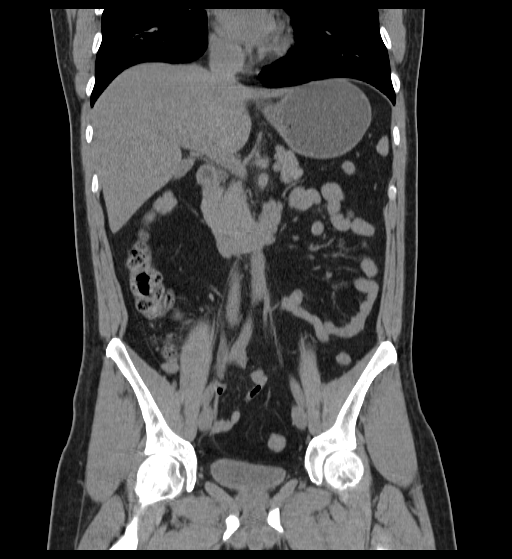
[im 66/119  soft-tissue]
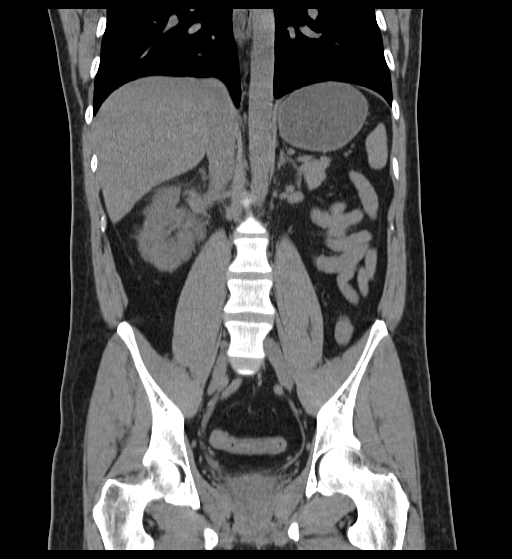

[17 of 46 positions shown; findings below may reference images not displayed]

FINDINGS: Visualized lung bases clear. Unremarkable liver, gallbladder,
spleen, adrenal glands, pancreas, left kidney. Unenhanced CT was
performed per clinician order. Lack of IV contrast limits
sensitivity and specificity, especially for evaluation of
abdominal/pelvic solid viscera.

There is right nephrolithiasis with a large 17 mm calculus centrally
in the collecting system, Smaller calculi in the lower pole
collecting system. There is ureterectasis down to the ureteral
orifice. A 1-2 mm calculus projects near the expected level of the
ureteral orifice. Urinary bladder is incompletely distended.

Stomach physiologically distended. Small bowel and colon nondilated.
Appendix not identified. No ascites. No free air. No adenopathy.
Lumbar spine intact.
IMPRESSION: 1. Right nephrolithiasis with a prominent 17 mm calculus centrally
in the collecting system.
2. Right Hydronephrosis and ureterectasis probably secondary to a
1-2 mm calculus near the ureteral orifice.

## 2016-06-23 ENCOUNTER — Other Ambulatory Visit: Payer: Self-pay

## 2016-06-23 DIAGNOSIS — N3281 Overactive bladder: Secondary | ICD-10-CM

## 2016-06-23 MED ORDER — SOLIFENACIN SUCCINATE 10 MG PO TABS: 10.0000 mg | ORAL_TABLET | Freq: Every day | ORAL | 5 refills | Status: AC

## 2016-10-22 ENCOUNTER — Ambulatory Visit: Payer: Managed Care, Other (non HMO) | Admitting: Urology

## 2016-10-26 ENCOUNTER — Ambulatory Visit: Payer: Managed Care, Other (non HMO) | Admitting: Urology

## 2017-10-19 ENCOUNTER — Other Ambulatory Visit: Payer: Self-pay | Admitting: Physical Medicine and Rehabilitation

## 2017-10-19 DIAGNOSIS — M5412 Radiculopathy, cervical region: Secondary | ICD-10-CM

## 2017-11-02 ENCOUNTER — Ambulatory Visit: Payer: Managed Care, Other (non HMO)

## 2017-11-03 ENCOUNTER — Ambulatory Visit: Payer: Managed Care, Other (non HMO)

## 2018-03-21 IMAGING — CR DG ABDOMEN 1V
1 series · 2 of 2 positions shown · non-contrast
Comparison: KUB of 07/25/2014 and CT abdomen pelvis of 04/10/2014

CLINICAL DATA: Pain in the left posterior abdomen, history of
kidney stones

EXAM:
ABDOMEN - 1 VIEW

[Series 1: dg abd 1 view · 0.14mm/px · 2 of 2 slices shown]
[im 1/2]
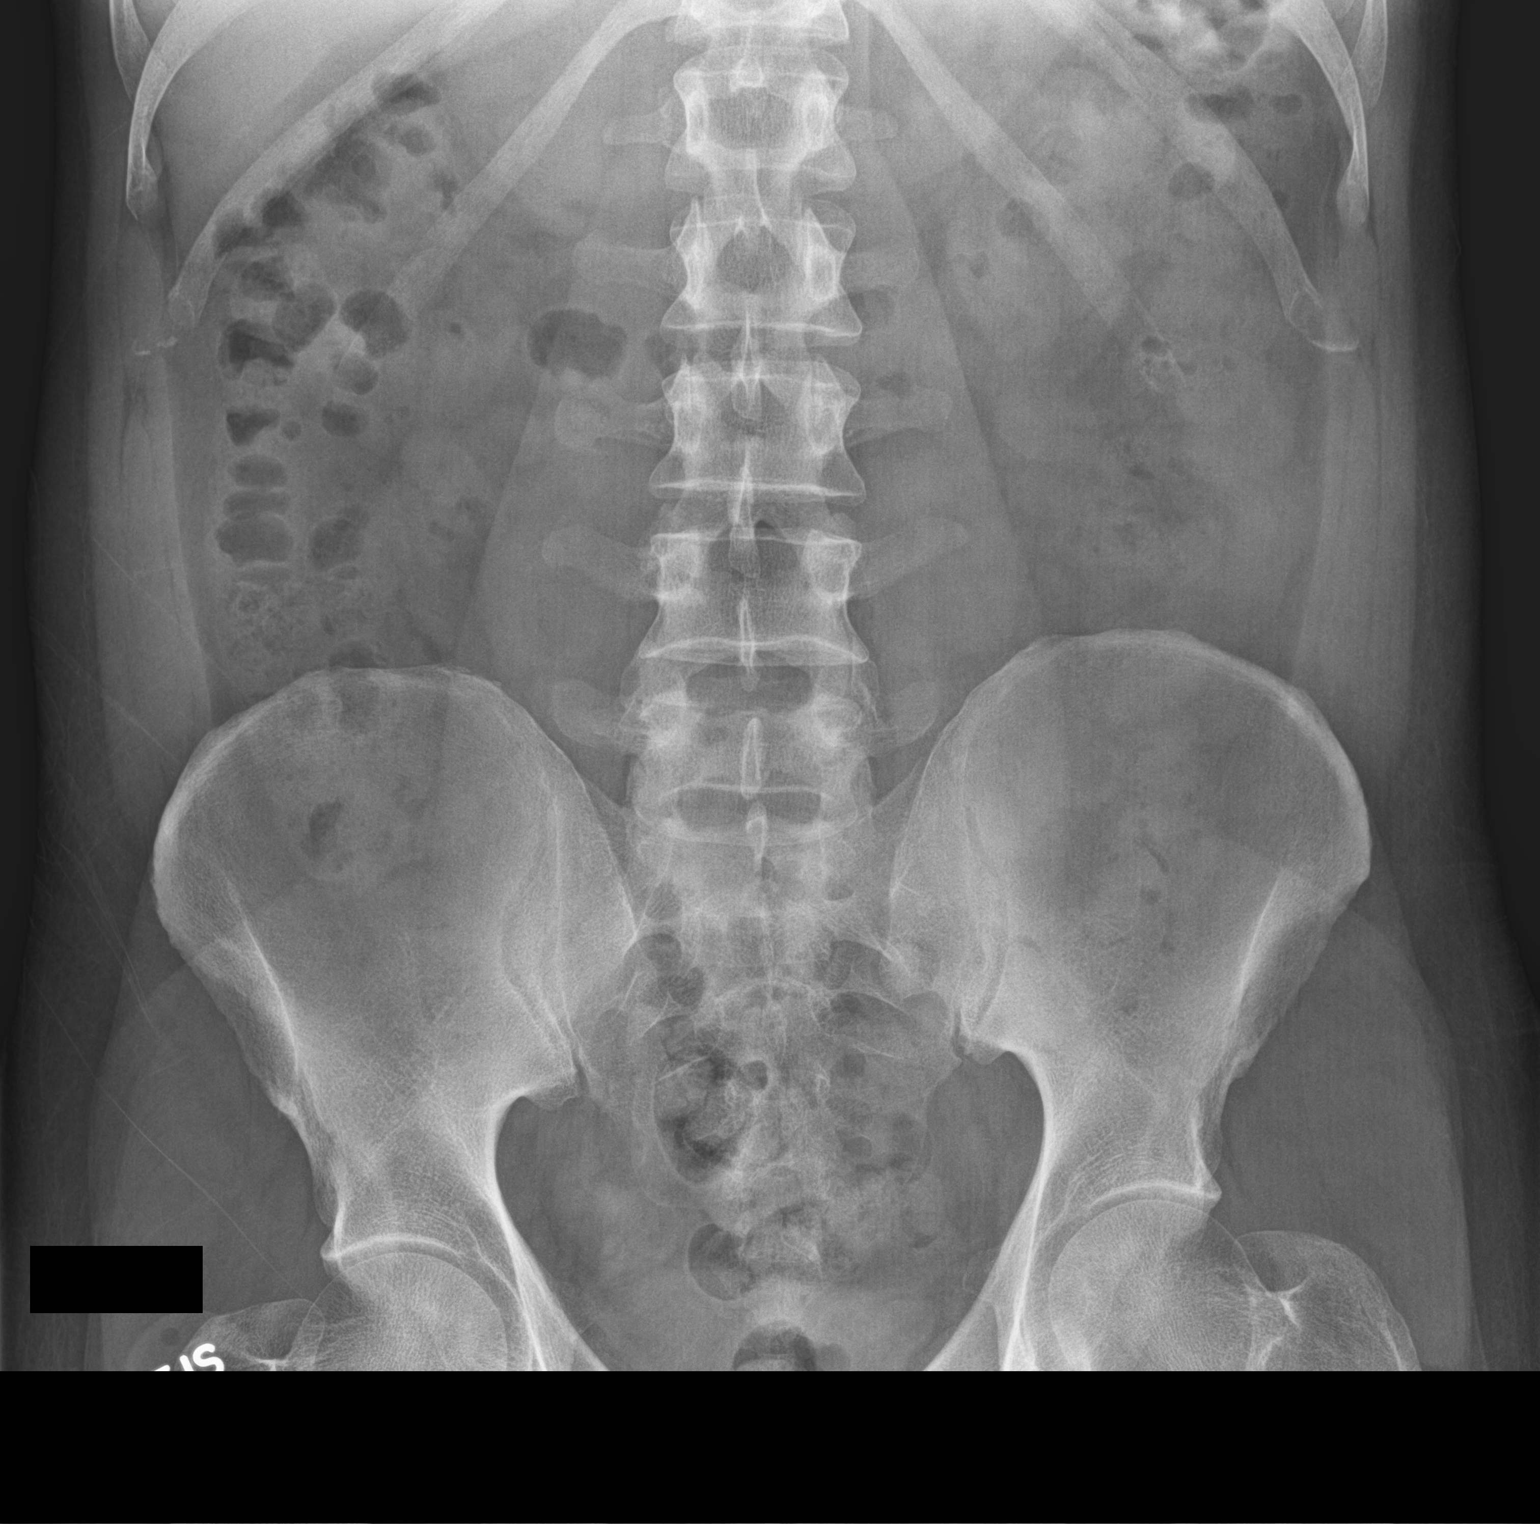
[im 2/2]
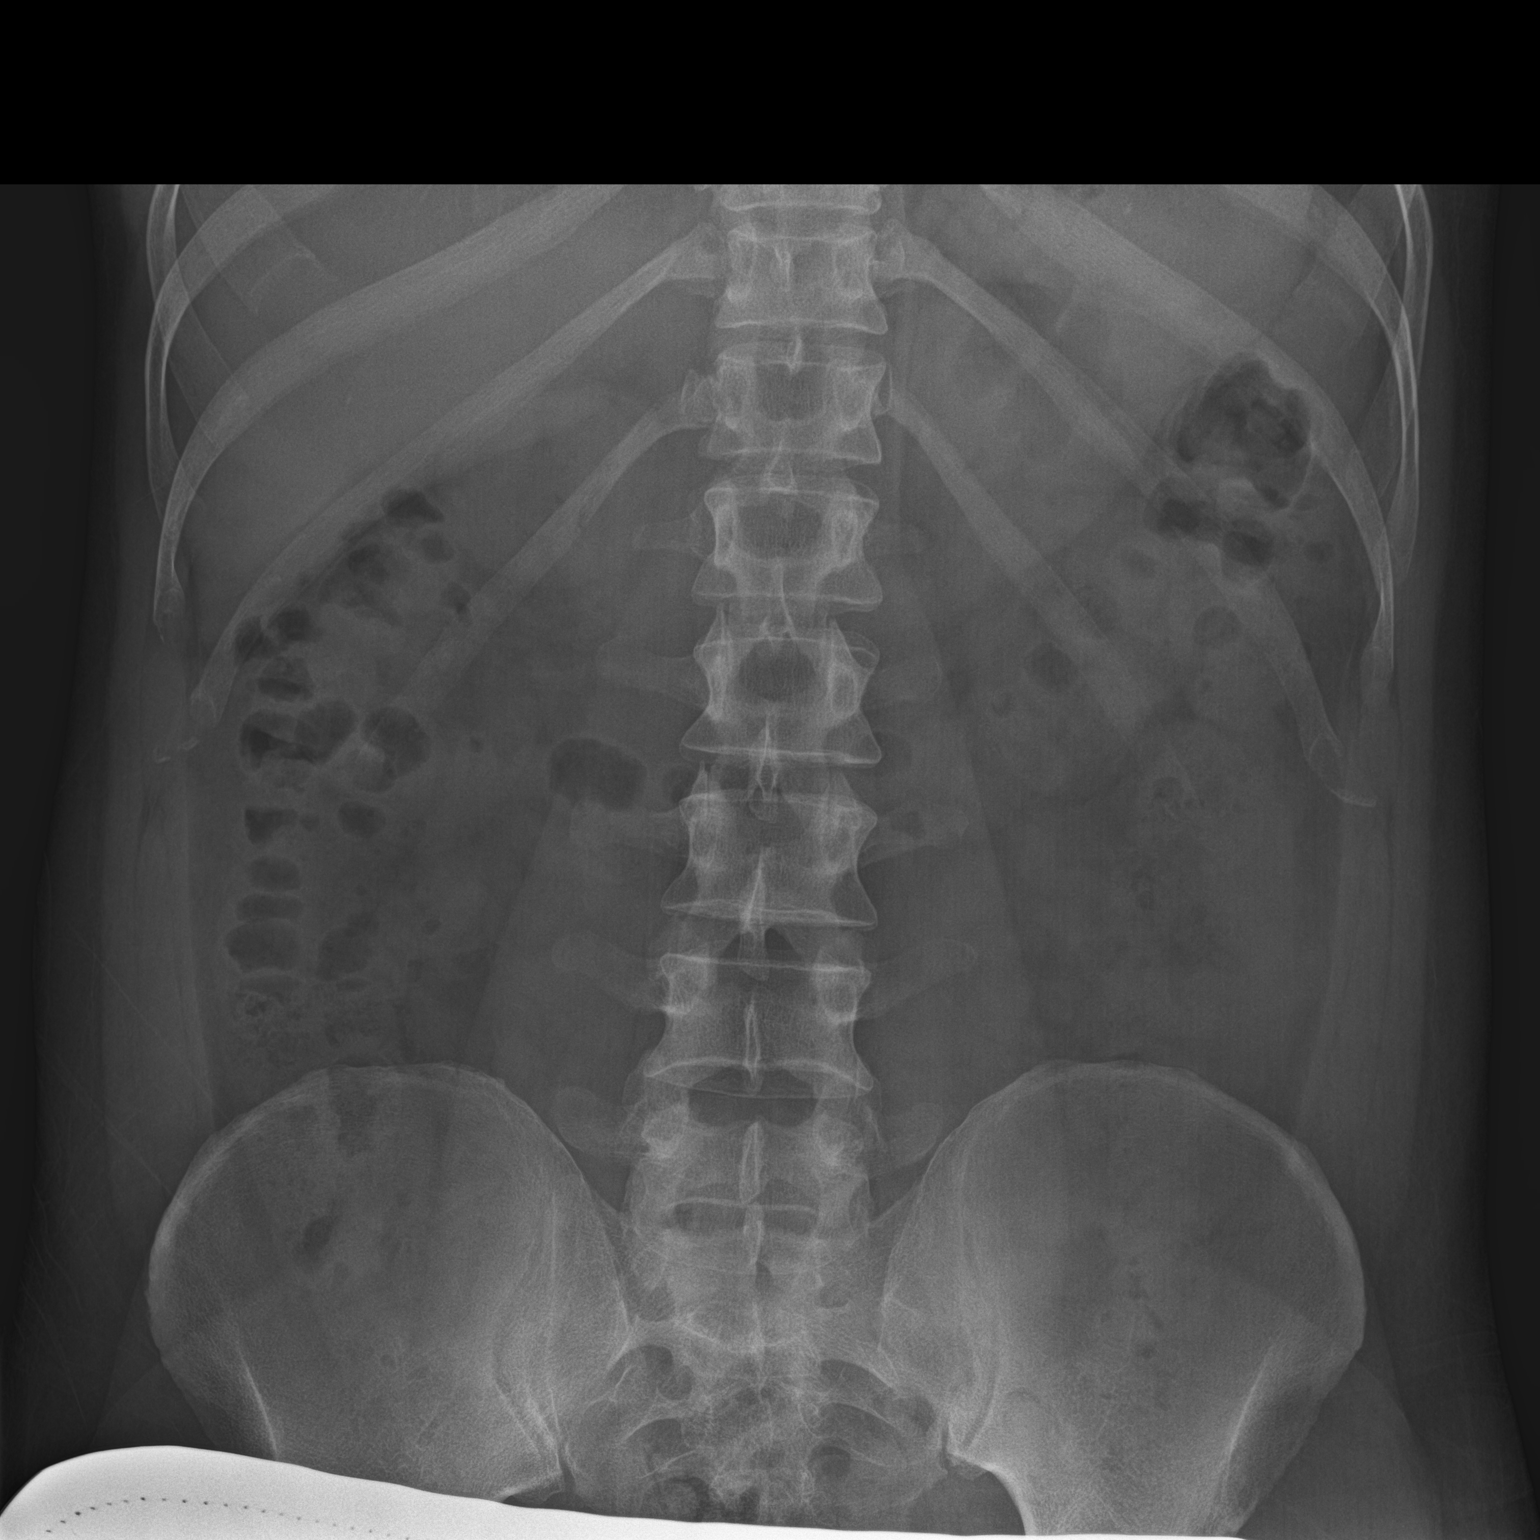

[2 of 2 positions shown; findings below may reference images not displayed]

FINDINGS: By plain film, no renal or ureteral calculi are seen. The bowel gas
pattern is nonspecific. No bony abnormality is seen.
IMPRESSION: No definite renal or ureteral calculi are seen by plain film.

## 2018-08-22 ENCOUNTER — Encounter: Payer: Self-pay | Admitting: Urology

## 2018-08-22 ENCOUNTER — Ambulatory Visit: Payer: Managed Care, Other (non HMO) | Admitting: Urology

## 2018-08-22 NOTE — Progress Notes (Deleted)
08/22/2018 8:58 AM   Nicholas Mitchell 10/13/1970 [id number]  Referring provider: Sherron Mondayejan-Sie, S Ahmed, MD [address] [place] [id number]  No chief complaint on file.   HPI:    PMH: Past Medical History:  Diagnosis Date  . Allergy   . Anxiety   . Diabetes mellitus without complication (HCC)   . Irritable bowel syndrome   . Kidney disease     Surgical History: Past Surgical History:  Procedure Laterality Date  . kidney stone removal      Home Medications:  Allergies as of 08/22/2018   No Known Allergies     Medication List       Accurate as of August 22, 2018  8:58 AM. If you have any questions, ask your nurse or doctor.        metFORMIN 500 MG tablet Commonly known as: GLUCOPHAGE Take 500 mg by mouth.   prednisoLONE acetate 1 % ophthalmic suspension Commonly known as: PRED FORTE USE 1 DROP IN RIGHT EYE TWICE DAILY AND IN LEFT EYE ONCE DAILY   solifenacin 10 MG tablet Commonly known as: VESICARE Take 1 tablet (10 mg total) by mouth daily.       Allergies: No Known Allergies  Family History: No family history on file.  Social History:  reports that he has never smoked. He does not have any smokeless tobacco history on file. He reports that he does not drink alcohol. No history on file for drug.  ROS:                                        Physical Exam: There were no vitals taken for this visit.  Constitutional:  Alert and oriented, No acute distress. HEENT: [place] AT, moist mucus membranes.  Trachea midline, no masses. Cardiovascular: No clubbing, cyanosis, or edema. Respiratory: Normal respiratory effort, no increased work of breathing. GI: Abdomen is soft, nontender, nondistended, no abdominal masses GU: No CVA tenderness Lymph: No cervical or inguinal lymphadenopathy. Skin: No rashes, bruises or suspicious lesions. Neurologic: Grossly intact, no focal deficits, moving all 4 extremities. Psychiatric: Normal mood  and affect.  Laboratory Data: Lab Results  Component Value Date   WBC 8.6 06/13/2014   HGB 13.1 06/13/2014   HCT 39.7 (L) 06/13/2014   MCV 94 06/13/2014   PLT 219 06/13/2014    Lab Results  Component Value Date   CREATININE 0.82 06/13/2014    No results found for: PSA  No results found for: TESTOSTERONE  No results found for: HGBA1C  Urinalysis    Component Value Date/Time   COLORURINE Yellow 06/07/2014 1036   APPEARANCEUR Cloudy (A) 02/11/2016 1422   LABSPEC 1.017 06/07/2014 1036   PHURINE 5.0 06/07/2014 1036   GLUCOSEU Negative 02/11/2016 1422   GLUCOSEU Negative 06/07/2014 1036   HGBUR 3+ 06/07/2014 1036   BILIRUBINUR Negative 02/11/2016 1422   BILIRUBINUR Negative 06/07/2014 1036   KETONESUR Negative 06/07/2014 1036   PROTEINUR Negative 02/11/2016 1422   PROTEINUR 100 mg/dL 19/14/782904/03/2014 [id number]   NITRITE Negative 02/11/2016 1422   NITRITE Negative 06/07/2014 1036   LEUKOCYTESUR Negative 02/11/2016 1422   LEUKOCYTESUR 2+ 06/07/2014 1036    Lab Results  Component Value Date   LABMICR See below: 02/11/2016   WBCUA 0-5 02/11/2016   RBCUA None seen 02/11/2016   LABEPIT None seen 02/11/2016   BACTERIA None seen 02/11/2016    Pertinent Imaging: ***  Results for orders placed during the hospital encounter of 02/11/16  Abdomen 1 view ([facility])   Narrative CLINICAL DATA:  Pain in the left posterior abdomen, history of kidney stones  EXAM: ABDOMEN - 1 VIEW  COMPARISON:  [facility] of 07/25/2014 and CT abdomen pelvis of 04/10/2014  FINDINGS: By plain film, no renal or ureteral calculi are seen. The bowel gas pattern is nonspecific. No bony abnormality is seen.  IMPRESSION: No definite renal or ureteral calculi are seen by plain film.   Electronically Signed   By: Ivar Drape M.D.   On: 02/11/2016 16:05    No results found for this or any previous visit. No results found for this or any previous visit. No results found for this or any previous visit. No results  found for this or any previous visit. No results found for this or any previous visit. No results found for this or any previous visit. No results found for this or any previous visit.  Assessment & Plan:    There are no diagnoses linked to this encounter.  No follow-ups on file.  Hollice Espy, MD  [facility] [address] [place], [place] [address] [phone]

## 2018-11-03 ENCOUNTER — Other Ambulatory Visit: Payer: Self-pay | Admitting: Surgery

## 2018-11-03 DIAGNOSIS — M4302 Spondylolysis, cervical region: Secondary | ICD-10-CM

## 2018-11-03 DIAGNOSIS — M542 Cervicalgia: Secondary | ICD-10-CM

## 2018-11-03 DIAGNOSIS — M4722 Other spondylosis with radiculopathy, cervical region: Secondary | ICD-10-CM

## 2018-11-17 ENCOUNTER — Ambulatory Visit: Payer: 59
